# Patient Record
Sex: Female | Born: 1970 | Race: Black or African American | Hispanic: No | Marital: Single | Smoking: Never smoker
Health system: Southern US, Community
[De-identification: ages and names within clinical notes are randomized; demographics above are authoritative.]

## PROBLEM LIST (undated history)

## (undated) DIAGNOSIS — E049 Nontoxic goiter, unspecified: Secondary | ICD-10-CM

## (undated) DIAGNOSIS — Z8742 Personal history of other diseases of the female genital tract: Secondary | ICD-10-CM

## (undated) DIAGNOSIS — A6000 Herpesviral infection of urogenital system, unspecified: Secondary | ICD-10-CM

## (undated) DIAGNOSIS — G4700 Insomnia, unspecified: Secondary | ICD-10-CM

## (undated) DIAGNOSIS — E119 Type 2 diabetes mellitus without complications: Secondary | ICD-10-CM

## (undated) DIAGNOSIS — D530 Protein deficiency anemia: Secondary | ICD-10-CM

## (undated) DIAGNOSIS — F419 Anxiety disorder, unspecified: Secondary | ICD-10-CM

## (undated) DIAGNOSIS — R87810 Cervical high risk human papillomavirus (HPV) DNA test positive: Secondary | ICD-10-CM

## (undated) HISTORY — DX: Protein deficiency anemia: D53.0

## (undated) HISTORY — PX: COLPOSCOPY: SHX161

## (undated) HISTORY — DX: Insomnia, unspecified: G47.00

## (undated) HISTORY — DX: Personal history of other diseases of the female genital tract: Z87.42

## (undated) HISTORY — DX: Nontoxic goiter, unspecified: E04.9

## (undated) HISTORY — DX: Cervical high risk human papillomavirus (HPV) DNA test positive: R87.810

## (undated) HISTORY — DX: Anxiety disorder, unspecified: F41.9

## (undated) HISTORY — PX: COMBINED HYSTEROSCOPY DIAGNOSTIC / D&C: SUR297

## (undated) HISTORY — PX: CHOLECYSTECTOMY: SHX55

## (undated) HISTORY — DX: Herpesviral infection of urogenital system, unspecified: A60.00

---

## 2000-05-16 DIAGNOSIS — O321XX0 Maternal care for breech presentation, not applicable or unspecified: Secondary | ICD-10-CM

## 2006-01-16 ENCOUNTER — Ambulatory Visit: Payer: Self-pay

## 2008-03-03 ENCOUNTER — Ambulatory Visit: Payer: Self-pay

## 2009-08-11 ENCOUNTER — Ambulatory Visit: Payer: Self-pay

## 2010-08-15 ENCOUNTER — Ambulatory Visit: Payer: Self-pay

## 2010-08-15 HISTORY — PX: BREAST BIOPSY: SHX20

## 2010-08-29 ENCOUNTER — Ambulatory Visit: Payer: Self-pay | Admitting: Surgery

## 2011-02-14 ENCOUNTER — Ambulatory Visit: Payer: Self-pay | Admitting: Obstetrics and Gynecology

## 2011-02-23 ENCOUNTER — Ambulatory Visit: Payer: Self-pay | Admitting: Obstetrics and Gynecology

## 2011-08-14 ENCOUNTER — Ambulatory Visit: Payer: Self-pay | Admitting: Obstetrics and Gynecology

## 2011-08-16 ENCOUNTER — Ambulatory Visit: Payer: Self-pay | Admitting: Obstetrics and Gynecology

## 2011-08-22 ENCOUNTER — Ambulatory Visit: Payer: Self-pay | Admitting: Obstetrics and Gynecology

## 2011-09-15 ENCOUNTER — Ambulatory Visit: Payer: Self-pay | Admitting: Obstetrics and Gynecology

## 2011-10-16 ENCOUNTER — Ambulatory Visit: Payer: Self-pay | Admitting: Obstetrics and Gynecology

## 2012-01-08 IMAGING — US ULTRASOUND LEFT BREAST
1 series · 16 of 16 positions shown · non-contrast
Comparison: none

REASON FOR EXAM: Palpable 2 cm mass 12 o'clock left breast ; Jiwon Haubert at
[HOSPITAL] to follow
COMMENTS:

[Series 1: ultrasound left breast · 16 of 16 slices shown]
[im 1/16]
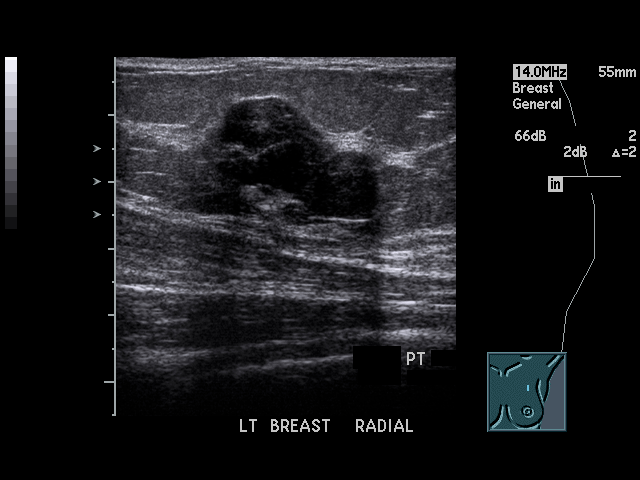
[im 2/16]
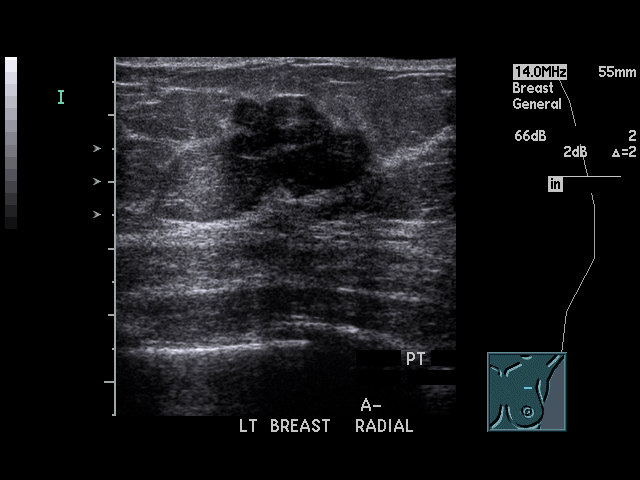
[im 3/16]
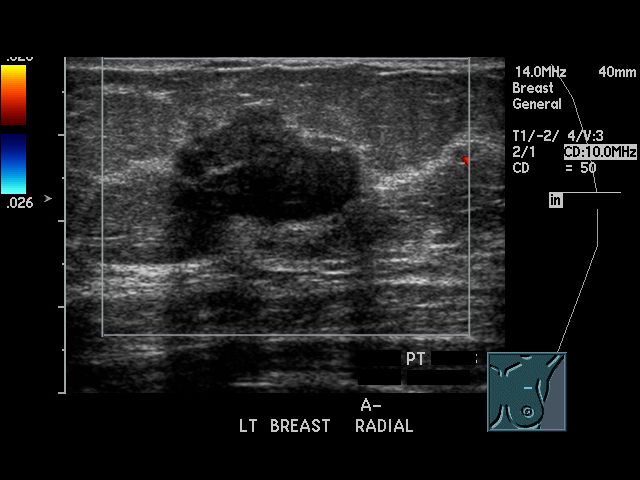
[im 4/16]
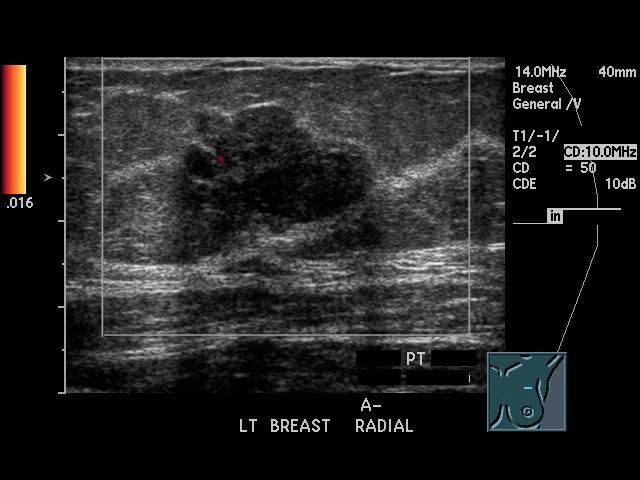
[im 5/16]
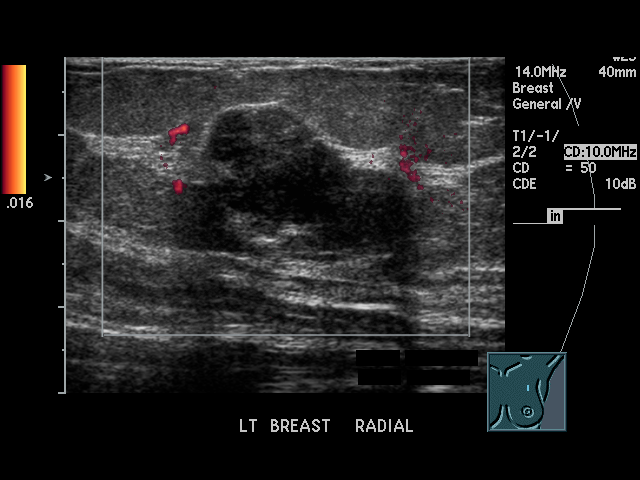
[im 6/16]
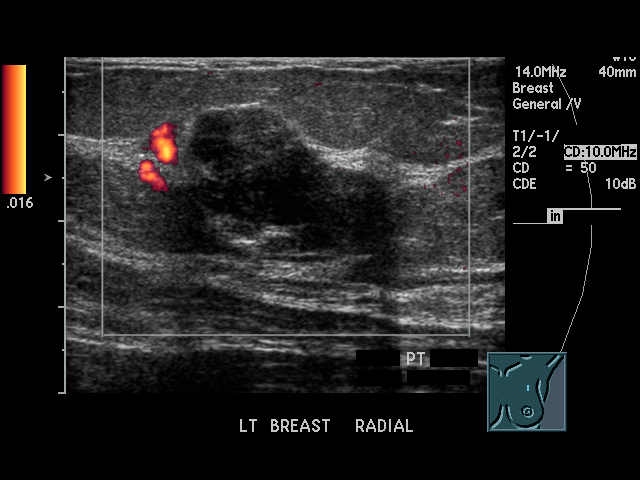
[im 7/16]
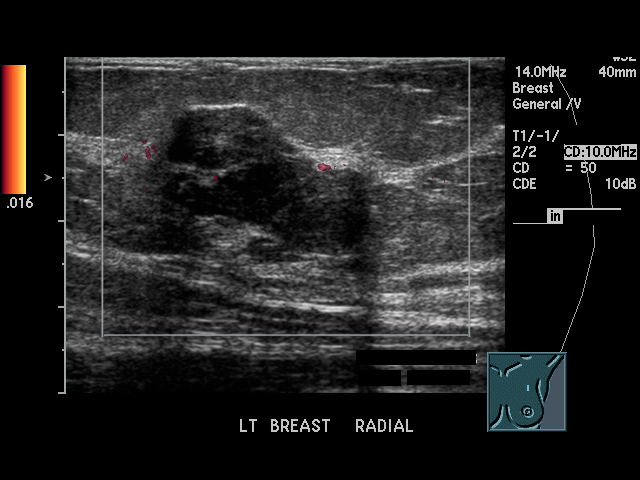
[im 8/16]
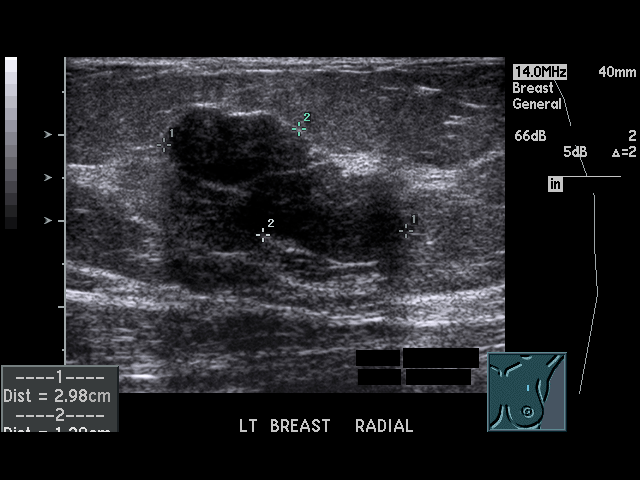
[im 9/16]
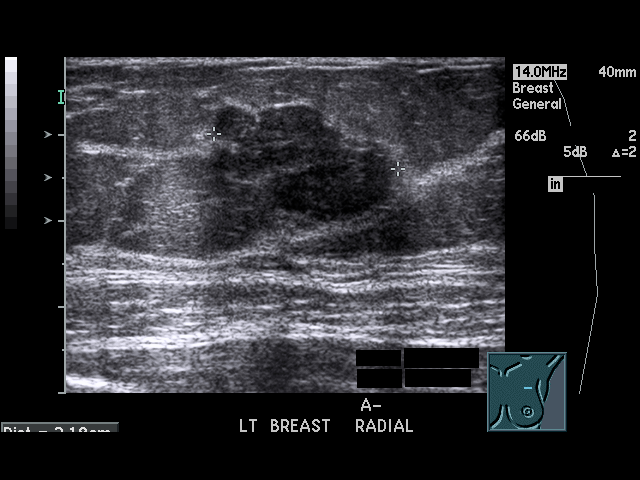
[im 10/16]
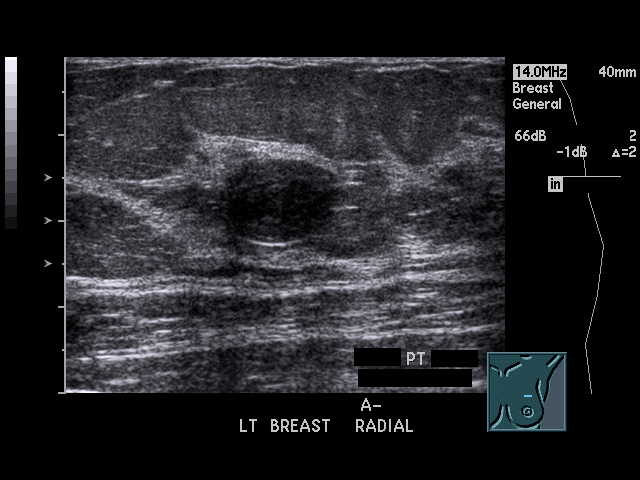
[im 11/16]
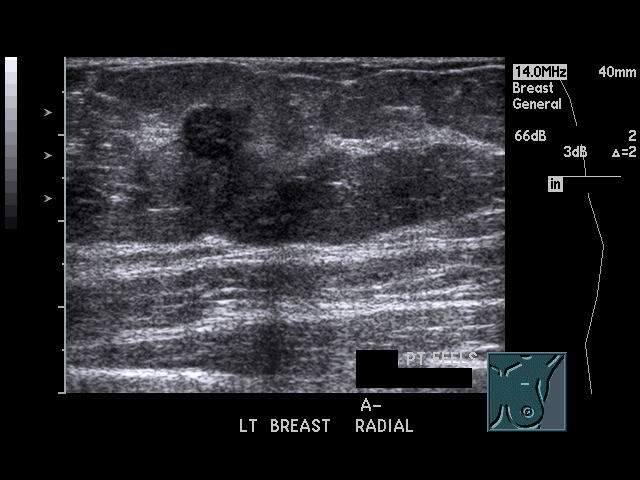
[im 12/16]
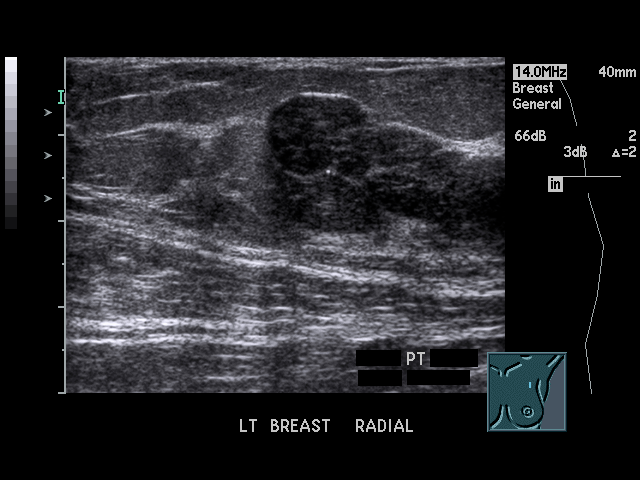
[im 13/16]
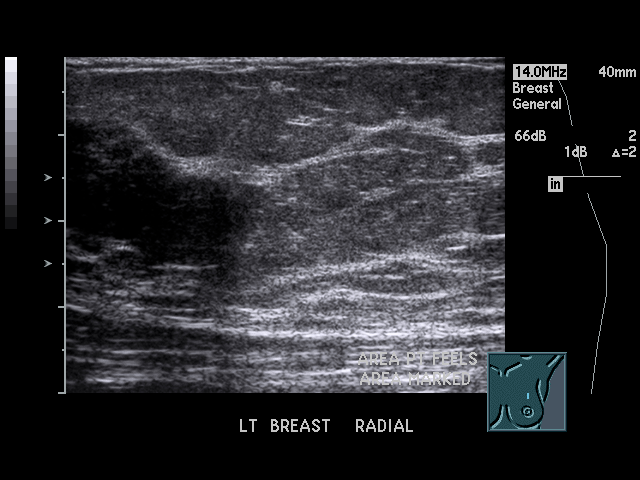
[im 14/16]
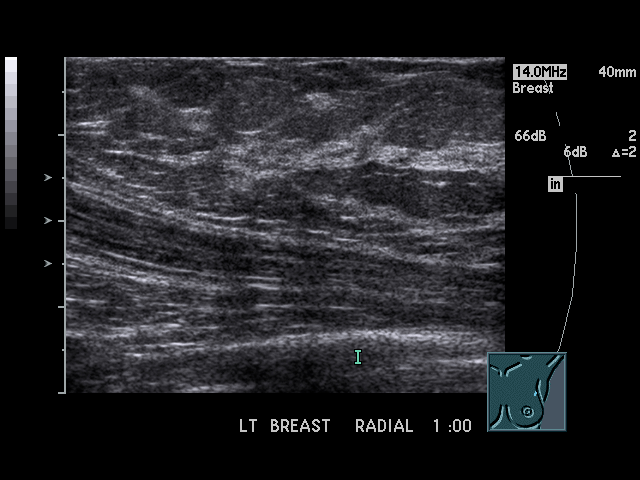
[im 15/16]
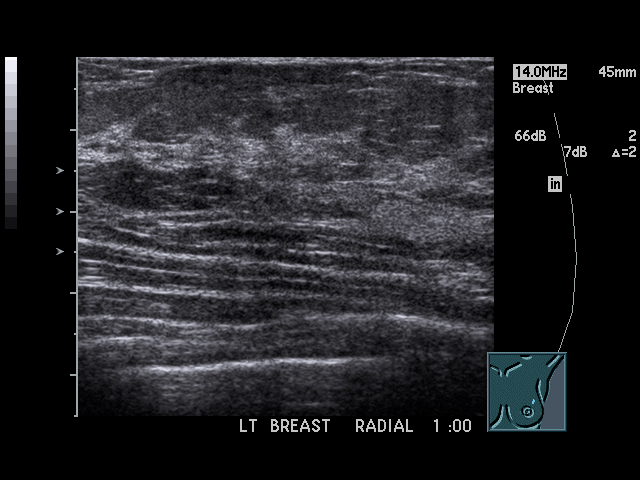
[im 16/16]
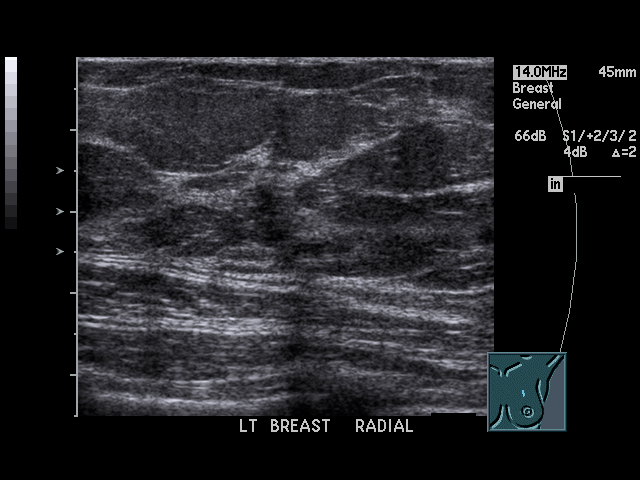

[16 of 16 positions shown; findings below may reference images not displayed]

PROCEDURE:     US  - US LT BREAST ([REDACTED])  - August 15, 2010 [DATE]

RESULT:     The study is correlated with a prior ultrasound report dated
08/11/2010.

The region of palpable concern is at the 12 o'clock position of the left
breast. A primarily hypoechoic, lobulated mass is identified with primarily
defined borders. There are areas of increased through transmission. This
nodular mass measures 2.98 x 1.3 x 2.18 cm. There are areas of peripheral
shadowing also associated with this mass. When correlated with the previous
report, the appearance of the mass is unchanged as well as the size. The
mass measured 2.17 x 1.4 x 2.2 cm on 08/11/2009. This mass is stable when
correlated with the previous report. No further solid or cystic
abnormalities are identified in the region of palpable concern.
IMPRESSION: Stable, lobulated, benign appearing mass within the left
breast when correlated with the previous report and prior mammograms dating
back to 01/16/2006. Please refer to the bilateral digital diagnostic
dictation for completed discussion.

## 2012-08-26 ENCOUNTER — Ambulatory Visit: Payer: Self-pay | Admitting: Obstetrics and Gynecology

## 2013-06-17 ENCOUNTER — Ambulatory Visit: Payer: Self-pay | Admitting: Orthopedic Surgery

## 2013-06-24 ENCOUNTER — Ambulatory Visit: Payer: Self-pay | Admitting: Orthopedic Surgery

## 2013-08-25 HISTORY — PX: INTRAUTERINE DEVICE INSERTION: SHX323

## 2013-09-07 ENCOUNTER — Other Ambulatory Visit: Payer: Self-pay | Admitting: Obstetrics and Gynecology

## 2013-09-09 ENCOUNTER — Ambulatory Visit: Payer: Self-pay | Admitting: Obstetrics and Gynecology

## 2013-10-15 DIAGNOSIS — Z8742 Personal history of other diseases of the female genital tract: Secondary | ICD-10-CM

## 2013-10-15 HISTORY — DX: Personal history of other diseases of the female genital tract: Z87.42

## 2014-01-18 HISTORY — PX: BIOPSY THYROID: PRO38

## 2014-09-13 ENCOUNTER — Ambulatory Visit: Payer: Self-pay

## 2015-07-04 DIAGNOSIS — E042 Nontoxic multinodular goiter: Secondary | ICD-10-CM | POA: Insufficient documentation

## 2015-11-22 ENCOUNTER — Encounter: Payer: Self-pay | Admitting: *Deleted

## 2015-11-22 ENCOUNTER — Emergency Department
Admission: EM | Admit: 2015-11-22 | Discharge: 2015-11-22 | Disposition: A | Discharge status: Home / Self Care | Payer: Commercial Managed Care - PPO | Attending: Emergency Medicine | Admitting: Emergency Medicine

## 2015-11-22 DIAGNOSIS — R21 Rash and other nonspecific skin eruption: Secondary | ICD-10-CM | POA: Insufficient documentation

## 2015-11-22 DIAGNOSIS — M79621 Pain in right upper arm: Secondary | ICD-10-CM | POA: Diagnosis present

## 2015-11-22 NOTE — ED Notes (Signed)
No xrays or labs at this time per dr Dahlia Client.

## 2015-11-22 NOTE — ED Notes (Addendum)
Pt has pain in right upper arm.  No known injury.  Pt has redness/rash to right upper arm .  Sx began at 1800 earlier tonight.  Pt took motrin 800 at 1900.  No relief with meds.  Pt alert.  Speech clear.

## 2016-01-20 ENCOUNTER — Encounter: Payer: Self-pay | Admitting: Emergency Medicine

## 2016-01-20 ENCOUNTER — Emergency Department
Admission: EM | Admit: 2016-01-20 | Discharge: 2016-01-20 | Disposition: A | Discharge status: Home / Self Care | Payer: Commercial Managed Care - PPO | Attending: Emergency Medicine | Admitting: Emergency Medicine

## 2016-01-20 DIAGNOSIS — J40 Bronchitis, not specified as acute or chronic: Secondary | ICD-10-CM | POA: Diagnosis not present

## 2016-01-20 DIAGNOSIS — F1099 Alcohol use, unspecified with unspecified alcohol-induced disorder: Secondary | ICD-10-CM | POA: Diagnosis not present

## 2016-01-20 DIAGNOSIS — I10 Essential (primary) hypertension: Secondary | ICD-10-CM | POA: Insufficient documentation

## 2016-01-20 DIAGNOSIS — Z041 Encounter for examination and observation following transport accident: Secondary | ICD-10-CM | POA: Diagnosis present

## 2016-01-20 HISTORY — DX: Type 2 diabetes mellitus without complications: E11.9

## 2016-01-20 MED ORDER — AZITHROMYCIN 250 MG PO TABS: ORAL_TABLET | ORAL | Status: DC

## 2016-01-20 MED ORDER — ALBUTEROL SULFATE HFA 108 (90 BASE) MCG/ACT IN AERS: 2.0000 | INHALATION_SPRAY | RESPIRATORY_TRACT | Status: DC | PRN

## 2016-01-20 MED ORDER — PREDNISONE 10 MG PO TABS: 10.0000 mg | ORAL_TABLET | ORAL | Status: DC
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
## 2016-01-20 NOTE — ED Notes (Signed)
Pt alert and oriented X4, active, cooperative, pt in NAD. RR even and unlabored, color WNL.  Pt informed to return if any life threatening symptoms occur.   
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
## 2016-01-29 ENCOUNTER — Emergency Department
Admission: EM | Admit: 2016-01-29 | Discharge: 2016-01-29 | Disposition: A | Discharge status: Home / Self Care | Payer: Commercial Managed Care - PPO | Attending: Emergency Medicine | Admitting: Emergency Medicine

## 2016-01-29 ENCOUNTER — Encounter: Payer: Self-pay | Admitting: Emergency Medicine

## 2016-01-29 DIAGNOSIS — B9689 Other specified bacterial agents as the cause of diseases classified elsewhere: Secondary | ICD-10-CM | POA: Insufficient documentation

## 2016-01-29 DIAGNOSIS — Z792 Long term (current) use of antibiotics: Secondary | ICD-10-CM | POA: Diagnosis not present

## 2016-01-29 DIAGNOSIS — Z7951 Long term (current) use of inhaled steroids: Secondary | ICD-10-CM | POA: Insufficient documentation

## 2016-01-29 DIAGNOSIS — B3731 Acute candidiasis of vulva and vagina: Secondary | ICD-10-CM

## 2016-01-29 DIAGNOSIS — Z7952 Long term (current) use of systemic steroids: Secondary | ICD-10-CM | POA: Diagnosis not present

## 2016-01-29 DIAGNOSIS — B373 Candidiasis of vulva and vagina: Secondary | ICD-10-CM | POA: Insufficient documentation

## 2016-01-29 DIAGNOSIS — E119 Type 2 diabetes mellitus without complications: Secondary | ICD-10-CM | POA: Diagnosis not present

## 2016-01-29 DIAGNOSIS — L292 Pruritus vulvae: Secondary | ICD-10-CM | POA: Diagnosis present

## 2016-01-29 DIAGNOSIS — N760 Acute vaginitis: Secondary | ICD-10-CM | POA: Diagnosis not present

## 2016-01-29 LAB — WET PREP, GENITAL
SPERM: NONE SEEN
Trich, Wet Prep: NONE SEEN

## 2016-01-29 MED ORDER — FLUCONAZOLE 150 MG PO TABS: 150.0000 mg | ORAL_TABLET | Freq: Every day | ORAL | Status: DC

## 2016-01-29 MED ORDER — METRONIDAZOLE 500 MG PO TABS: 500.0000 mg | ORAL_TABLET | Freq: Two times a day (BID) | ORAL | Status: DC

## 2016-01-29 NOTE — ED Notes (Signed)
NAD noted at time of D/C. Pt denies questions or concerns. Pt ambulatory to the lobby at this time.  

## 2016-01-29 NOTE — ED Notes (Signed)
States "I have a yeast infection from these medications".  Patient took z-pak last week.

## 2016-01-29 NOTE — ED Provider Notes (Signed)
[facility] Emergency Department Provider Note  ____________________________________________  Time seen: Approximately 8:01 AM  I have reviewed the triage vital signs and the nursing notes.   HISTORY  Chief Complaint Vaginal Itching   HPI Jillian Foster is a 45 y.o. female is here with complaint of vaginal itching. Patient states that last week she took Zithromax for infection and began itching yesterday. Patient states that  "always gets a yeast infection".Patient has not used any over-the-counter medication for her yeast infection or symptoms. Patient states she usually gets a prescription for Diflucan anytime she takes an antibiotic. Blood sugars usually run between 180 and 200. She denies any other symptoms and has 0/10 pain.   Past Medical History  Diagnosis Date  . Diabetes mellitus without complication ([place])     There are no active problems to display for this patient.   History reviewed. No pertinent past surgical history.  Current Outpatient Rx  Name  Route  Sig  Dispense  Refill  . albuterol (PROVENTIL HFA;VENTOLIN HFA) 108 (90 Base) MCG/ACT inhaler   Inhalation   Inhale 2 puffs into the lungs every 4 (four) hours as needed for wheezing or shortness of breath.   1 Inhaler   0   . azithromycin (ZITHROMAX Z-PAK) 250 MG tablet      Take 2 tablets (500 mg) on  Day 1,  followed by 1 tablet (250 mg) once daily on Days 2 through 5.   6 each   0   . fluconazole (DIFLUCAN) 150 MG tablet   Oral   Take 1 tablet (150 mg total) by mouth daily.   1 tablet   1   . metroNIDAZOLE (FLAGYL) 500 MG tablet   Oral   Take 1 tablet (500 mg total) by mouth 2 (two) times daily.   14 tablet   0   . predniSONE (DELTASONE) 10 MG tablet   Oral   Take 1 tablet (10 mg total) by mouth as directed.   21 tablet   0     Take on a daily basis of 6, 5, 4, 3, 2, 1     Allergies Review of patient's allergies indicates no known allergies.  No  family history on file.  Social History Social History  Substance Use Topics  . Smoking status: Never Smoker   . Smokeless tobacco: None  . Alcohol Use: Yes    Review of Systems Constitutional: No fever/chills Cardiovascular: Denies chest pain. Respiratory: Denies shortness of breath. Gastrointestinal: No nausea, no vomiting. Genitourinary:  Positive vaginal itching. Musculoskeletal: Negative for back pain. Skin: Negative for rash.   10-point ROS otherwise negative.  ____________________________________________   PHYSICAL EXAM:  VITAL SIGNS: ED Triage Vitals  Enc Vitals Group     BP 01/29/16 0732 125/73 mmHg     Pulse Rate 01/29/16 0732 84     Resp 01/29/16 0732 16     Temp 01/29/16 0732 98.2 F (36.8 C)     Temp Source 01/29/16 0732 Oral     SpO2 01/29/16 0732 97 %     Weight 01/29/16 0732 184 lb (83.462 kg)     Height 01/29/16 0732 5' 7"  (1.702 m)     Head Cir --      Peak Flow --      Pain Score 01/29/16 0734 0     Pain Loc --      Pain Edu? --      Excl. in [place]? --     Constitutional:  Alert and oriented. Well appearing and in no acute distress. Eyes: Conjunctivae are normal. PERRL. EOMI. Head: Atraumatic. Nose: No congestion/rhinnorhea. Neck: No stridor.  Cardiovascular: Normal rate, regular rhythm. Grossly normal heart sounds.  Good peripheral circulation. Respiratory: Normal respiratory effort.  No retractions. Lungs CTAB. Gastrointestinal: Soft and nontender. No distention.  No CVA tenderness. Genitourinary: Vaginal exam there is moderate amount of white exudate present. There is no blood in the vaginal vault. There is no tenderness on palpation of the adnexal areas bilaterally nor is there any cervical motion tenderness. Musculoskeletal: No lower extremity tenderness nor edema.  No joint effusions. Neurologic:  Normal speech and language. No gross focal neurologic deficits are appreciated. No gait instability. Skin:  Skin is warm, dry and intact. No  rash noted. Psychiatric: Mood and affect are normal. Speech and behavior are normal.  ____________________________________________   LABS (all labs ordered are listed, but only abnormal results are displayed)  Labs Reviewed  WET PREP, GENITAL - Abnormal; Notable for the following:    Yeast Wet Prep HPF POC PRESENT (*)    Clue Cells Wet Prep HPF POC PRESENT (*)    WBC, Wet Prep HPF POC MANY (*)    All other components within normal limits     PROCEDURES  Procedure(s) performed: None  Critical Care performed: No  ____________________________________________   INITIAL IMPRESSION / ASSESSMENT AND PLAN / ED COURSE  Pertinent labs & imaging results that were available during my care of the patient were reviewed by me and considered in my medical decision making (see chart for details).  Patient was made aware that she does indeed have a yeast infection but she also has bacterial vaginosis which she has had in the past. Patient was placed on Diflucan along with Flagyl. She'll follow-up with her regular OB/GYN if any continued problems. ____________________________________________   FINAL CLINICAL IMPRESSION(S) / ED DIAGNOSES  Final diagnoses:  Yeast vaginitis  BV (bacterial vaginosis)      Johnn Hai, PA-C 01/29/16 1545

## 2016-01-29 NOTE — Discharge Instructions (Signed)
Bacterial Vaginosis Bacterial vaginosis is an infection of the vagina. It happens when too many germs (bacteria) grow in the vagina. Having this infection puts you at risk for getting other infections from sex. Treating this infection can help lower your risk for other infections, such as:   Chlamydia.  Gonorrhea.  HIV.  Herpes. HOME CARE  Take your medicine as told by your doctor.  Finish your medicine even if you start to feel better.  Tell your sex partner that you have an infection. They should see their doctor for treatment.  During treatment:  Avoid sex or use condoms correctly.  Do not douche.  Do not drink alcohol unless your doctor tells you it is ok.  Do not breastfeed unless your doctor tells you it is ok. GET HELP IF:  You are not getting better after 3 days of treatment.  You have more grey fluid (discharge) coming from your vagina than before.  You have more pain than before.  You have a fever. MAKE SURE YOU:   Understand these instructions.  Will watch your condition.  Will get help right away if you are not doing well or get worse.   This information is not intended to replace advice given to you by your health care provider. Make sure you discuss any questions you have with your health care provider.   Document Released: 07/10/2008 Document Revised: 10/22/2014 Document Reviewed: 05/13/2013 Elsevier Interactive Patient Education 2016 [place] with your OB/GYN if any continued problems. Flagyl twice a day until finished. Diflucan for yeast infection with 1 refill if needed.

## 2016-05-22 ENCOUNTER — Other Ambulatory Visit: Payer: Self-pay | Admitting: Certified Nurse Midwife

## 2016-06-12 LAB — HM PAP SMEAR: HM PAP: NEGATIVE

## 2017-02-28 ENCOUNTER — Encounter: Payer: Self-pay | Admitting: Obstetrics and Gynecology

## 2017-02-28 ENCOUNTER — Ambulatory Visit (INDEPENDENT_AMBULATORY_CARE_PROVIDER_SITE_OTHER): Payer: Commercial Managed Care - PPO | Admitting: Obstetrics and Gynecology

## 2017-02-28 VITALS — BP 124/68 | HR 80 | Ht 66.0 in | Wt 196.0 lb

## 2017-02-28 DIAGNOSIS — E669 Obesity, unspecified: Secondary | ICD-10-CM | POA: Diagnosis not present

## 2017-02-28 DIAGNOSIS — Z6831 Body mass index (BMI) 31.0-31.9, adult: Secondary | ICD-10-CM

## 2017-02-28 MED ORDER — PHENTERMINE HCL 37.5 MG PO TABS: 37.5000 mg | ORAL_TABLET | Freq: Every day | ORAL | 0 refills | Status: DC

## 2017-02-28 NOTE — Progress Notes (Signed)
Gynecology Office Visit  Chief Complaint:  Chief Complaint  Patient presents with  . Weight Check    History of Present Illness: Patientis a 46 y.o. G73P0011 female, who presents for the evaluation of the desire to lose weight. She has gained 15 pounds since her last visit. Weight related co-morbidities include DM II.  She has previously taken phentermine with good response.  She is interested in restarting phentermine.   Review of Systems: 10 point review of systems negative unless otherwise noted in HPI  Past Medical History:  Past Medical History:  Diagnosis Date  . Diabetes mellitus without complication [facility])     Past Surgical History:  Past Surgical History:  Procedure Laterality Date  . CHOLECYSTECTOMY      Gynecologic History: No LMP recorded. Patient is not currently having periods (Reason: IUD).  Obstetric History: [id number]  Family History:  History reviewed. No pertinent family history.  Social History:  Social History   Social History  . Marital status: Single    Spouse name: N/A  . Number of children: N/A  . Years of education: N/A   Occupational History  . Not on file.   Social History Main Topics  . Smoking status: Never Smoker  . Smokeless tobacco: Never Used  . Alcohol use Yes  . Drug use: No  . Sexual activity: Yes    Birth control/ protection: IUD   Other Topics Concern  . Not on file   Social History Narrative  . No narrative on file    Allergies:  No Known Allergies  Medications: Prior to Admission medications   Medication Sig Start Date End Date Taking? Authorizing Provider  albuterol (PROVENTIL HFA;VENTOLIN HFA) 108 (90 Base) MCG/ACT inhaler Inhale 2 puffs into the lungs every 4 (four) hours as needed for wheezing or shortness of breath. 01/20/16  Yes Cuthriell, Charline Bills, PA-C  glipiZIDE (GLUCOTROL) 5 MG tablet Take by mouth daily before breakfast.   Yes [provider]  glucose blood ([employer] TEST STRIPS) test  strip Use once daily. 02/02/15  Yes [provider]  metFORMIN (GLUCOPHAGE-XR) 500 MG 24 hr tablet Take 500 mg by mouth daily with breakfast.   Yes [provider]  pravastatin (PRAVACHOL) 20 MG tablet Take 20 mg by mouth daily.   Yes [provider]  levonorgestrel (MIRENA) 20 MCG/24HR IUD by Intrauterine route.    [provider]    Physical Exam Vitals:  Vitals:   02/28/17 1646  BP: 124/68  Pulse: 80   No LMP recorded. Patient is not currently having periods (Reason: IUD). Filed Weights   02/28/17 1646  Weight: 196 lb (88.9 kg)   Body mass index is 31.64 kg/m. General: NAD HEENT: normocephalic, anicteric Thyroid: no enlargement Pulmonary: no increased work of breathing Neurologic: Grossly intact Psychiatric: mood appropriate, affect full  Assessment: 46 y.o. G2P0011 No problem-specific Assessment & Plan notes found for this encounter.   Plan: Problem List Items Addressed This Visit    None      1) 1500 Calorie ADA Diet  2) Patient education given regarding appropriate lifestyle changes for weight loss including: regular physical activity, healthy coping strategies, caloric restriction and healthy eating patterns.  3) Patient will be started on weight loss medication. The risks and benefits and side effects of medication, such as Adipex (Phenteramine) ,  Tenuate (Diethylproprion), Belviq (lorcarsin), Contrave (buproprion/naltrexone), Qsymia (phentermine/topiramate), and Saxenda (liraglutide) is discussed. The pros and cons of suppressing appetite and boosting metabolism is discussed. Risks  of tolerence and addiction is discussed for selected agents discussed. Use of medicine will ne short term, such as 3-4 months at a time followed by a period of time off of the medicine to avoid these risks and side effects for Adipex, Qsymia, and Tenuate discussed. Pt to call with any negative side effects and agrees to keep follow up appts.  4) Patient  to take medication, with the benefits of appetite suppression and metabolism boost d/w pt, along with the side effects and risk factors of long term use that will be avoided with our use of short bursts of therapy. Rx provided.    5) 15 minutes face-to-face; with counseling/coordination of care > 50 percent of visit related to obesity and ongoing management/treatment   6) Follow up in 4 weeks to assess response

## 2017-02-28 NOTE — Patient Instructions (Signed)

## 2017-03-26 ENCOUNTER — Encounter: Payer: Self-pay | Admitting: Obstetrics and Gynecology

## 2017-03-27 ENCOUNTER — Encounter: Payer: Self-pay | Admitting: Obstetrics and Gynecology

## 2017-03-27 ENCOUNTER — Ambulatory Visit (INDEPENDENT_AMBULATORY_CARE_PROVIDER_SITE_OTHER): Payer: Commercial Managed Care - PPO | Admitting: Obstetrics and Gynecology

## 2017-03-27 VITALS — BP 120/78 | HR 103 | Wt 195.0 lb

## 2017-03-27 DIAGNOSIS — Z6831 Body mass index (BMI) 31.0-31.9, adult: Secondary | ICD-10-CM | POA: Diagnosis not present

## 2017-03-27 DIAGNOSIS — E669 Obesity, unspecified: Secondary | ICD-10-CM | POA: Diagnosis not present

## 2017-03-27 MED ORDER — PHENTERMINE-TOPIRAMATE ER 3.75-23 MG PO CP24: 1.0000 | ORAL_CAPSULE | Freq: Every day | ORAL | 0 refills | Status: DC

## 2017-03-27 MED ORDER — PHENTERMINE-TOPIRAMATE ER 7.5-46 MG PO CP24: 1.0000 | ORAL_CAPSULE | Freq: Every day | ORAL | 0 refills | Status: DC

## 2017-03-27 NOTE — Progress Notes (Signed)
Gynecology Office Visit  Chief Complaint:  Chief Complaint  Patient presents with  . Weight Check    History of Present Illness: Patientis a 46 y.o. G45P1011 female, who presents for the evaluation of the desire to lose weight. She has lost 1 pounds. The patient states the following symptoms since starting her weight loss therapy: appetite suppression, energy, and weight loss.  The patient also reports no other ill effects. The patient specifically denies heart palpitations, anxiety, and insomnia.    Review of Systems: 10 point review of systems negative unless otherwise noted in HPI  Past Medical History:  Past Medical History:  Diagnosis Date  . 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 Anxiety   . Diabetes mellitus without complication ([place])   . Genital herpes   . Goiter   . Insomnia     Past Surgical History:  Past Surgical History:  Procedure Laterality Date  . BIOPSY THYROID  01/18/2014   B9 FNA  . BREAST BIOPSY  08/2010   Left benign   Fibroadenoma  . CESAREAN SECTION  05/2000   Failed ECV..Breech  . CHOLECYSTECTOMY    . COLPOSCOPY    . COMBINED HYSTEROSCOPY DIAGNOSTIC / D&C    . INTRAUTERINE DEVICE INSERTION  08/25/2013    Gynecologic History: No LMP recorded. Patient is not currently having periods (Reason: IUD).  Obstetric History: G2P1011  Family History:  Family History  Problem Relation Age of Onset  . Diabetes Maternal Grandmother        Type 2    Social History:  Social History   Social History  . Marital status: Single    Spouse name: N/A  . Number of children: N/A  . Years of education: N/A   Occupational History  . Not on file.   Social History Main Topics  . Smoking status: Never Smoker  . Smokeless tobacco: Never Used  . Alcohol use Yes  . Drug use: No  . Sexual activity: Yes    Birth control/ protection: IUD   Other Topics Concern  . Not on file   Social History Narrative  . No narrative on file    Allergies:  No  Known Allergies  Medications: Prior to Admission medications   Medication Sig Start Date End Date Taking? Authorizing Provider  albuterol (PROVENTIL HFA;VENTOLIN HFA) 108 (90 Base) MCG/ACT inhaler Inhale 2 puffs into the lungs every 4 (four) hours as needed for wheezing or shortness of breath. 01/20/16  Yes Cuthriell, Charline Bills, PA-C  glipiZIDE (GLUCOTROL) 5 MG tablet Take by mouth daily before breakfast.   Yes [provider]  glucose blood ([employer] TEST STRIPS) test strip Use once daily. 02/02/15  Yes [provider]  levonorgestrel (MIRENA) 20 MCG/24HR IUD by Intrauterine route.   Yes [provider]  metFORMIN (GLUCOPHAGE-XR) 500 MG 24 hr tablet Take 500 mg by mouth daily with breakfast.   Yes [provider]  phentermine (ADIPEX-P) 37.5 MG tablet Take 1 tablet (37.5 mg total) by mouth daily before breakfast. 02/28/17  Yes [name], MD  pravastatin (PRAVACHOL) 20 MG tablet Take 20 mg by mouth daily.   Yes [provider]    Physical Exam Vitals:  Vitals:   03/27/17 1625  BP: 120/78  Pulse: (!) 103   No LMP recorded. Patient is not currently having periods (Reason: IUD). Filed Weights   03/27/17 1625  Weight: 195 lb (88.5 kg)   Body mass index is 31.47 kg/m.  General: NAD HEENT:  normocephalic, anicteric Thyroid: no enlargement Pulmonary: no increased work of breathing Neurologic: Grossly intact Psychiatric: mood appropriate, affect full  Assessment: 46 y.o. G2P1011 No problem-specific Assessment & Plan notes found for this encounter.   Plan: Problem List Items Addressed This Visit    None      1) 1500 Calorie ADA Diet  2) Patient education given regarding appropriate lifestyle changes for weight loss including: regular physical activity, healthy coping strategies, caloric restriction and healthy eating patterns.  3) Patient will be started on weight loss medication. The risks and benefits and side effects of  medication, such as Adipex (Phenteramine) ,  Tenuate (Diethylproprion), Belviq (lorcarsin), Contrave (buproprion/naltrexone), Qsymia (phentermine/topiramate), and Saxenda (liraglutide) is discussed. The pros and cons of suppressing appetite and boosting metabolism is discussed. Risks of tolerence and addiction is discussed for selected agents discussed. Use of medicine will ne short term, such as 3-4 months at a time followed by a period of time off of the medicine to avoid these risks and side effects for Adipex, Qsymia, and Tenuate discussed. Pt to call with any negative side effects and agrees to keep follow up appts. Only 1 lbs weight loss, has tried belviq, contrave, and diethylproprion, will trial on Qsymia  4) Patient to take medication, with the benefits of appetite suppression and metabolism boost d/w pt, along with the side effects and risk factors of long term use that will be avoided with our use of short bursts of therapy. Rx provided.    5) 15 minutes face-to-face; with counseling/coordination of care > 50 percent of visit related to obesity and ongoing management/treatment   6) Follow up in 4 weeks to assess response

## 2017-04-01 ENCOUNTER — Other Ambulatory Visit: Payer: Self-pay | Admitting: Obstetrics and Gynecology

## 2017-04-01 ENCOUNTER — Telehealth: Payer: Self-pay | Admitting: Obstetrics and Gynecology

## 2017-04-01 MED ORDER — PHENTERMINE HCL 37.5 MG PO TABS: 37.5000 mg | ORAL_TABLET | Freq: Every day | ORAL | 0 refills | Status: DC

## 2017-04-01 NOTE — Progress Notes (Signed)
Weight correct 189lbs contine phentermine 7lbs weight loss in 1 month

## 2017-04-01 NOTE — Telephone Encounter (Signed)
Pt is calling to speak with Maudie Mercury, Dr. Danielle Rankin Nurse . Please call patient

## 2017-04-01 NOTE — Telephone Encounter (Signed)
Pt has question.  Please call [phone]

## 2017-04-01 NOTE — Telephone Encounter (Signed)
Shawn texted me and wants you to call her personally. [phone]

## 2017-04-01 NOTE — Telephone Encounter (Signed)
Call pt 

## 2017-05-02 ENCOUNTER — Encounter: Payer: Self-pay | Admitting: Obstetrics and Gynecology

## 2017-05-02 ENCOUNTER — Ambulatory Visit (INDEPENDENT_AMBULATORY_CARE_PROVIDER_SITE_OTHER): Payer: Commercial Managed Care - PPO | Admitting: Obstetrics and Gynecology

## 2017-05-02 VITALS — BP 120/78 | Wt 198.0 lb

## 2017-05-02 DIAGNOSIS — Z6831 Body mass index (BMI) 31.0-31.9, adult: Secondary | ICD-10-CM

## 2017-05-02 DIAGNOSIS — E669 Obesity, unspecified: Secondary | ICD-10-CM | POA: Diagnosis not present

## 2017-05-02 MED ORDER — LORCASERIN HCL ER 20 MG PO TB24: 1.0000 | ORAL_TABLET | Freq: Every day | ORAL | 2 refills | Status: DC

## 2017-05-02 NOTE — Progress Notes (Signed)
Gynecology Office Visit  Chief Complaint:  Chief Complaint  Patient presents with  . Weight Check    History of Present Illness: Patientis a 46 y.o. G12P1011 female, who presents for the evaluation of the desire to lose weight. She has gained 3 pounds. The patient states the following symptoms since starting her weight loss therapy: appetite suppression, energy, and weight loss.  The patient also reports no other ill effects. The patient specifically denies heart palpitations, anxiety, and insomnia.    Review of Systems: 10 point review of systems negative unless otherwise noted in HPI  Past Medical History:  Past Medical History:  Diagnosis Date  . 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 Anxiety   . Diabetes mellitus without complication ([place])   . Genital herpes   . Goiter   . Insomnia     Past Surgical History:  Past Surgical History:  Procedure Laterality Date  . BIOPSY THYROID  01/18/2014   B9 FNA  . BREAST BIOPSY  08/2010   Left benign   Fibroadenoma  . CESAREAN SECTION  05/2000   Failed ECV..Breech  . CHOLECYSTECTOMY    . COLPOSCOPY    . COMBINED HYSTEROSCOPY DIAGNOSTIC / D&C    . INTRAUTERINE DEVICE INSERTION  08/25/2013    Gynecologic History: No LMP recorded. Patient is not currently having periods (Reason: IUD).  Obstetric History: G2P1011  Family History:  Family History  Problem Relation Age of Onset  . Diabetes Maternal Grandmother        Type 2    Social History:  Social History   Social History  . Marital status: Single    Spouse name: N/A  . Number of children: N/A  . Years of education: N/A   Occupational History  . Not on file.   Social History Main Topics  . Smoking status: Never Smoker  . Smokeless tobacco: Never Used  . Alcohol use Yes  . Drug use: No  . Sexual activity: Yes    Birth control/ protection: IUD   Other Topics Concern  . Not on file   Social History Narrative  . No narrative on file    Allergies:  No  Known Allergies  Medications: Prior to Admission medications   Medication Sig Start Date End Date Taking? Authorizing Provider  albuterol (PROVENTIL HFA;VENTOLIN HFA) 108 (90 Base) MCG/ACT inhaler Inhale 2 puffs into the lungs every 4 (four) hours as needed for wheezing or shortness of breath. 01/20/16   Cuthriell, Charline Bills, PA-C  glipiZIDE (GLUCOTROL) 5 MG tablet Take by mouth daily before breakfast.    [provider]  glucose blood ([employer] TEST STRIPS) test strip Use once daily. 02/02/15   [provider]  levonorgestrel (MIRENA) 20 MCG/24HR IUD by Intrauterine route.    [provider]  metFORMIN (GLUCOPHAGE-XR) 500 MG 24 hr tablet Take 500 mg by mouth daily with breakfast.    [provider]  phentermine (ADIPEX-P) 37.5 MG tablet Take 1 tablet (37.5 mg total) by mouth daily before breakfast. 04/01/17   [name], MD  pravastatin (PRAVACHOL) 20 MG tablet Take 20 mg by mouth daily.    [provider]    Physical Exam Blood pressure 120/78, weight 198 lb (89.8 kg). Body mass index is 31.96 kg/m.  General: NAD HEENT: normocephalic, anicteric Thyroid: no enlargement Pulmonary: no increased work of breathing Neurologic: Grossly intact Psychiatric: mood appropriate, affect full  Assessment: 46 y.o. G2P1011 weight loss follow up  Plan: Problem List Items  Addressed This Visit    None    Visit Diagnoses    Class 1 obesity with serious comorbidity and body mass index (BMI) of 31.0 to 31.9 in adult, unspecified obesity type    -  Primary   Relevant Medications   Lorcaserin HCl ER (BELVIQ XR) 20 MG TB24      1) 1500 Calorie ADA Diet  2) Patient education given regarding appropriate lifestyle changes for weight loss including: regular physical activity, healthy coping strategies, caloric restriction and healthy eating patterns.  3) Patient will be started on weight loss medication. The risks and benefits and side effects of  medication, such as Adipex (Phenteramine) ,  Tenuate (Diethylproprion), Belviq (lorcarsin), Contrave (buproprion/naltrexone), Qsymia (phentermine/topiramate), and Saxenda (liraglutide) is discussed. The pros and cons of suppressing appetite and boosting metabolism is discussed. Risks of tolerence and addiction is discussed for selected agents discussed. Use of medicine will ne short term, such as 3-4 months at a time followed by a period of time off of the medicine to avoid these risks and side effects for Adipex, Qsymia, and Tenuate discussed. Pt to call with any negative side effects and agrees to keep follow up appts.  4) Patient to take medication, with the benefits of appetite suppression and metabolism boost d/w pt, along with the side effects and risk factors of long term use that will be avoided with our use of short bursts of therapy. Rx provided (Qsymia to expensive so switching to belviq)   5) 15 minutes face-to-face; with counseling/coordination of care > 50 percent of visit related to obesity and ongoing management/treatment   6) Follow up in 12 weeks to assess response

## 2017-05-03 ENCOUNTER — Telehealth: Payer: Self-pay

## 2017-05-03 NOTE — Telephone Encounter (Signed)
Pt aware, via voicemail, Prior Authorization has been started through CoverMyMeds, for Belviq. I will keep her updated on status. KJ CMA

## 2017-05-08 ENCOUNTER — Encounter: Payer: Self-pay | Admitting: Obstetrics and Gynecology

## 2017-05-08 ENCOUNTER — Other Ambulatory Visit: Payer: Self-pay

## 2017-05-08 ENCOUNTER — Telehealth: Payer: Self-pay

## 2017-05-08 ENCOUNTER — Other Ambulatory Visit: Payer: Self-pay | Admitting: Obstetrics and Gynecology

## 2017-05-08 MED ORDER — LORCASERIN HCL ER 20 MG PO TB24: 1.0000 | ORAL_TABLET | Freq: Every day | ORAL | 2 refills | Status: DC

## 2017-05-08 NOTE — Telephone Encounter (Signed)
Pt aware, via voicemail, the Rx for Belviq has been approved through 11/06/2017. KJ CMA

## 2017-05-08 NOTE — Telephone Encounter (Signed)
Please send Belviq to [place]

## 2017-05-09 ENCOUNTER — Other Ambulatory Visit: Payer: Self-pay

## 2017-05-10 ENCOUNTER — Ambulatory Visit: Payer: Commercial Managed Care - PPO | Admitting: Obstetrics and Gynecology

## 2017-06-06 ENCOUNTER — Ambulatory Visit (INDEPENDENT_AMBULATORY_CARE_PROVIDER_SITE_OTHER): Payer: Commercial Managed Care - PPO

## 2017-06-06 ENCOUNTER — Ambulatory Visit (INDEPENDENT_AMBULATORY_CARE_PROVIDER_SITE_OTHER): Payer: Commercial Managed Care - PPO | Admitting: Obstetrics and Gynecology

## 2017-06-06 ENCOUNTER — Encounter: Payer: Self-pay | Admitting: Obstetrics and Gynecology

## 2017-06-06 VITALS — BP 102/72 | Ht 66.0 in | Wt 190.0 lb

## 2017-06-06 DIAGNOSIS — R05 Cough: Secondary | ICD-10-CM

## 2017-06-06 DIAGNOSIS — B9689 Other specified bacterial agents as the cause of diseases classified elsewhere: Secondary | ICD-10-CM | POA: Diagnosis not present

## 2017-06-06 DIAGNOSIS — Z975 Presence of (intrauterine) contraceptive device: Secondary | ICD-10-CM

## 2017-06-06 DIAGNOSIS — Z113 Encounter for screening for infections with a predominantly sexual mode of transmission: Secondary | ICD-10-CM

## 2017-06-06 DIAGNOSIS — N921 Excessive and frequent menstruation with irregular cycle: Secondary | ICD-10-CM | POA: Diagnosis not present

## 2017-06-06 DIAGNOSIS — R0981 Nasal congestion: Secondary | ICD-10-CM | POA: Diagnosis not present

## 2017-06-06 DIAGNOSIS — R059 Cough, unspecified: Secondary | ICD-10-CM

## 2017-06-06 DIAGNOSIS — Z30431 Encounter for routine checking of intrauterine contraceptive device: Secondary | ICD-10-CM

## 2017-06-06 DIAGNOSIS — N760 Acute vaginitis: Secondary | ICD-10-CM

## 2017-06-06 LAB — POCT WET PREP WITH KOH
Clue Cells Wet Prep HPF POC: POSITIVE
KOH PREP POC: POSITIVE — AB
Trichomonas, UA: NEGATIVE
Yeast Wet Prep HPF POC: NEGATIVE

## 2017-06-06 MED ORDER — METRONIDAZOLE 0.75 % VA GEL
1.0000 | Freq: Every day | VAGINAL | 1 refills | Status: AC
Start: 2017-06-06 — End: 2017-06-11

## 2017-06-06 NOTE — Progress Notes (Signed)
Chief Complaint  Patient presents with  . Contraception    IUD check    HPI:      Jillian Foster is a 46 y.o. G2P1011 who LMP was No LMP recorded. Patient is not currently having periods (Reason: IUD)., presents today for irregular bleeding for the past 2 wks. Blood is bright red and brown. She denies any pain, cramping. She has had amenorrhea since Mirena placed 08/21/13. She is sex active, no new partners.   She also has noticed and increased d/c with odor, no itching. She may have had BV in the past.   She also developed nasal congestion, chest tightness, cough and facial pressure, with sore throat in AM about 1 1/2 days ago. She is not taking any meds due to DM meds and not sure what she can take. She has felt feverish at night.    Past Medical History:  Diagnosis Date  . 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 Anxiety   . Diabetes mellitus without complication ([place])   . Genital herpes   . Goiter   . Insomnia     Past Surgical History:  Procedure Laterality Date  . BIOPSY THYROID  01/18/2014   B9 FNA  . BREAST BIOPSY  08/2010   Left benign   Fibroadenoma  . CESAREAN SECTION  05/2000   Failed ECV..Breech  . CHOLECYSTECTOMY    . COLPOSCOPY    . COMBINED HYSTEROSCOPY DIAGNOSTIC / D&C    . INTRAUTERINE DEVICE INSERTION  08/25/2013    Family History  Problem Relation Age of Onset  . Diabetes Maternal Grandmother        Type 2    Social History   Social History  . Marital status: Single    Spouse name: N/A  . Number of children: N/A  . Years of education: N/A   Occupational History  . Not on file.   Social History Main Topics  . Smoking status: Never Smoker  . Smokeless tobacco: Never Used  . Alcohol use Yes  . Drug use: No  . Sexual activity: Yes    Birth control/ protection: IUD   Other Topics Concern  . Not on file   Social History Narrative  . No narrative on file     Current Outpatient Prescriptions:  .  albuterol (PROVENTIL  HFA;VENTOLIN HFA) 108 (90 Base) MCG/ACT inhaler, Inhale 2 puffs into the lungs every 4 (four) hours as needed for wheezing or shortness of breath., Disp: 1 Inhaler, Rfl: 0 .  Blood Glucose Monitoring Suppl (GLUCOCOM BLOOD GLUCOSE MONITOR) DEVI, One Touch Verio meter. Use to check sugar once daily., Disp: , Rfl:  .  Dulaglutide 0.75 MG/0.5ML SOPN, Inject 0.75 mg into the skin., Disp: , Rfl:  .  empagliflozin (JARDIANCE) 10 MG TABS tablet, Take 10 mg by mouth., Disp: , Rfl:  .  glipiZIDE (GLUCOTROL) 5 MG tablet, Take by mouth daily before breakfast., Disp: , Rfl:  .  glucose blood ([employer] TEST STRIPS) test strip, Use once daily., Disp: , Rfl:  .  levonorgestrel (MIRENA) 20 MCG/24HR IUD, by Intrauterine route., Disp: , Rfl:  .  metFORMIN (GLUCOPHAGE-XR) 500 MG 24 hr tablet, Take 500 mg by mouth daily with breakfast., Disp: , Rfl:  .  pravastatin (PRAVACHOL) 20 MG tablet, Take 20 mg by mouth daily., Disp: , Rfl:  .  metroNIDAZOLE (METROGEL) 0.75 % vaginal gel, Place 1 Applicatorful vaginally at bedtime., Disp: 70 g, Rfl: 1   ROS:  Review of Systems  Constitutional: Positive for fever.  HENT: Positive for congestion, postnasal drip, rhinorrhea, sinus pressure and sore throat. Negative for ear discharge, ear pain and sinus pain.   Respiratory: Positive for cough and chest tightness. Negative for shortness of breath.   Gastrointestinal: Negative for blood in stool, constipation, diarrhea, nausea and vomiting.  Genitourinary: Positive for menstrual problem, vaginal discharge and vaginal pain. Negative for dyspareunia, dysuria, flank pain, frequency, hematuria, urgency and vaginal bleeding.  Musculoskeletal: Negative for back pain.  Skin: Negative for rash.     OBJECTIVE:   Vitals:  BP 102/72   Ht 5' 6"  (1.676 m)   Wt 190 lb (86.2 kg)   BMI 30.67 kg/m   Physical Exam  Constitutional: She is oriented to person, place, and time and well-developed, well-nourished, and in no distress. Vital  signs are normal.  HENT:  Nose: Right sinus exhibits no maxillary sinus tenderness and no frontal sinus tenderness. Left sinus exhibits no maxillary sinus tenderness and no frontal sinus tenderness.  Neck: Normal range of motion. Neck supple.  Cardiovascular: Normal rate and regular rhythm.   Pulmonary/Chest: Effort normal and breath sounds normal. She has no wheezes. She has no rales.  Genitourinary: Uterus normal, cervix normal, right adnexa normal, left adnexa normal and vulva normal. Uterus is not enlarged. Cervix exhibits no motion tenderness and no tenderness. Right adnexum displays no mass and no tenderness. Left adnexum displays no mass and no tenderness. Vulva exhibits no erythema, no exudate, no lesion, no rash and no tenderness. Vagina exhibits no lesion. Thin  white and vaginal discharge found.  Genitourinary Comments: IUD STRINGS NOT VISIBLE IN CX OS  Neurological: She is oriented to person, place, and time.  Vitals reviewed.   Results: Results for orders placed or performed in visit on 06/06/17 (from the past 24 hour(s))  POCT Wet Prep with KOH     Status: Abnormal   Collection Time: 06/06/17 12:00 PM  Result Value Ref Range   Trichomonas, UA Negative    Clue Cells Wet Prep HPF POC pos    Epithelial Wet Prep HPF POC  Few, Moderate, Many, Too numerous to count   Yeast Wet Prep HPF POC neg    Bacteria Wet Prep HPF POC  Few   RBC Wet Prep HPF POC     WBC Wet Prep HPF POC     KOH Prep POC Positive (A) Negative   GYN U/S-->EM=2.08 MM; IUD IN PLACE; NO FF IN CDS; 1.2 CM LEIO; BILAT OVAR WITH FOLLICLES    Assessment/Plan: Breakthrough bleeding with IUD - Neg GYN u/s. If gon/chlam neg, reassurance. F/u prn.  - Plan: Chlamydia/Gonococcus/Trichomonas, NAA, US Transvaginal Non-OB  Encounter for routine checking of intrauterine contraceptive device (IUD) - IUD strings not visible. IUD in place per u/s. Reassurance. - Plan: US Transvaginal Non-OB  Screening for STD (sexually  transmitted disease) - Plan: Chlamydia/Gonococcus/Trichomonas, NAA  Bacterial vaginosis - Pos wet prep. Rx metrogel. F/u prn.  - Plan: POCT Wet Prep with KOH, metroNIDAZOLE (METROGEL) 0.75 % vaginal gel  Nasal congestion - URI for 1 1/2 days. Can do OTC meds. F/u prn.   Cough    Return if symptoms worsen or fail to improve.  Latravia [place] B. Jacque Byron, PA-C 06/06/2017 12:09 PM

## 2017-06-08 LAB — CHLAMYDIA/GONOCOCCUS/TRICHOMONAS, NAA
CHLAMYDIA BY NAA: NEGATIVE
GONOCOCCUS BY NAA: NEGATIVE
TRICH VAG BY NAA: NEGATIVE

## 2017-06-25 ENCOUNTER — Ambulatory Visit (INDEPENDENT_AMBULATORY_CARE_PROVIDER_SITE_OTHER): Payer: Commercial Managed Care - PPO | Admitting: Certified Nurse Midwife

## 2017-06-25 ENCOUNTER — Encounter: Payer: Self-pay | Admitting: Certified Nurse Midwife

## 2017-06-25 VITALS — BP 102/62 | HR 98 | Ht 66.0 in | Wt 191.0 lb

## 2017-06-25 DIAGNOSIS — Z124 Encounter for screening for malignant neoplasm of cervix: Secondary | ICD-10-CM | POA: Diagnosis not present

## 2017-06-25 DIAGNOSIS — Z01419 Encounter for gynecological examination (general) (routine) without abnormal findings: Secondary | ICD-10-CM | POA: Diagnosis not present

## 2017-06-25 DIAGNOSIS — Z1239 Encounter for other screening for malignant neoplasm of breast: Secondary | ICD-10-CM

## 2017-06-25 DIAGNOSIS — Z1231 Encounter for screening mammogram for malignant neoplasm of breast: Secondary | ICD-10-CM | POA: Diagnosis not present

## 2017-06-25 DIAGNOSIS — Z1211 Encounter for screening for malignant neoplasm of colon: Secondary | ICD-10-CM | POA: Diagnosis not present

## 2017-06-25 DIAGNOSIS — E049 Nontoxic goiter, unspecified: Secondary | ICD-10-CM | POA: Diagnosis not present

## 2017-06-25 NOTE — Progress Notes (Signed)
Gynecology Annual Exam  PCP: Patient, No Pcp Per  Chief Complaint:  Chief Complaint  Patient presents with  . Gynecologic Exam    History of Present Illness:Jillian Foster is a 46 year old African American/Black female, G1 P1001, who presents for her annual exam. She reports having a pelvic exam, STD check and pelvic ultrasound last month when she presented with spotting. She was treated for BV. Her IUD strings were not visible and the pelvic ultrasound showed that her IUD was in place. Her BV symptoms have resolved. Her menses are absent due to her IUD.  She has occasional spotting.   The patient's past medical history is notable for a history of T2DM and is being followed by an endocrinologist at [facility]. 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 Reports having a long history of a left breast lump which was biopsied in 2011 and was B9. Does not think the lump has gotten any bigger. She also has a multinodular goiter and she has had some B9 biopsies by Dr Gabriel Carina.  Since her last annual GYN exam dated 06/12/2016, she has also been started on Trulicity.  She is sexually active. She is currently using an IUD for contraception. Mirena IUD was placed on 08/21/2013.  Her most recent pap smear was obtained 06/12/2016 and was NIL with negative HRHPV.Marland Kitchen She had a unsatisfactory colpo in Jan 2016 with a negative ECC.  Her most recent mammogram obtained on 09/13/2014 was normal and revealed no significant changes.  There is no family history of breast cancer.  There is no family history of ovarian cancer.  The patient does do monthly self breast exams.  The patient does not smoke.  The patient does drink alcohol occasionally  The patient does not use illegal drugs.  The patient does not exercise except for walking.  The patient does not get adequate calcium in her diet.  She had a recent cholesterol screen in 2018 that was normal.    The patient denies  current symptoms of depression.    Review of Systems: Review of Systems  Constitutional: Negative for chills, fever and weight loss.  HENT: Negative for congestion, sinus pain and sore throat.   Eyes: Negative for blurred vision and pain.  Respiratory: Negative for hemoptysis, shortness of breath and wheezing.   Cardiovascular: Negative for chest pain, palpitations and leg swelling.  Gastrointestinal: Negative for abdominal pain, blood in stool, diarrhea, heartburn, nausea and vomiting.  Genitourinary: Negative for dysuria, frequency, hematuria and urgency.  Musculoskeletal: Negative for back pain, joint pain and myalgias.  Skin: Negative for itching and rash.  Neurological: Negative for dizziness, tingling and headaches.  Endo/Heme/Allergies: Negative for environmental allergies and polydipsia. Does not bruise/bleed easily.       Negative for hirsutism   Psychiatric/Behavioral: Negative for depression. The patient is not nervous/anxious and does not have insomnia.     Past Medical History:  Past Medical History:  Diagnosis Date  . 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 Anxiety   . Diabetes mellitus without complication ([place])   . Genital herpes   . Goiter   . High risk human papilloma cervical smear 2015  . History of abnormal cervical Pap smear    POsitive HRHPV  . Insomnia     Past Surgical History:  Past Surgical History:  Procedure Laterality Date  . BIOPSY THYROID  01/18/2014   B9 FNA  .  BREAST BIOPSY  08/2010   Left benign   Fibroadenoma  . CESAREAN SECTION  05/2000   Failed ECV..Breech  . CHOLECYSTECTOMY    . COLPOSCOPY    . COMBINED HYSTEROSCOPY DIAGNOSTIC / D&C    . INTRAUTERINE DEVICE INSERTION  08/25/2013    Family History:  Family History  Problem Relation Age of Onset  . Diabetes Maternal Grandmother        Type 2  . Cancer Neg Hx   . Hypertension Neg Hx   . Stroke Neg Hx   . Thyroid disease Neg Hx     Social History:  Social History   Social  History  . Marital status: Single    Spouse name: N/A  . Number of children: 1  . Years of education: N/A   Occupational History  . Not on file.   Social History Main Topics  . Smoking status: Never Smoker  . Smokeless tobacco: Never Used  . Alcohol use Yes  . Drug use: No  . Sexual activity: Yes    Partners: Male    Birth control/ protection: IUD   Other Topics Concern  . Not on file   Social History Narrative  . No narrative on file    Allergies:  No Known Allergies  Medications: Prior to Admission medications   Medication Sig Start Date End Date Taking? Authorizing Provider         Blood Glucose Monitoring Suppl (GLUCOCOM BLOOD GLUCOSE MONITOR) DEVI One Touch Verio meter. Use to check sugar once daily. 12/19/16   [provider]  Dulaglutide 0.75 MG/0.5ML SOPN Inject 0.75 mg into the skin. 05/07/17   [provider]  empagliflozin (JARDIANCE) 10 MG TABS tablet Take 10 mg by mouth. 05/14/17 05/14/18  [provider]  glipiZIDE (GLUCOTROL) 5 MG tablet Take by mouth daily before breakfast.    [provider]  glucose blood ([employer] TEST STRIPS) test strip Use once daily. 02/02/15   [provider]  levonorgestrel (MIRENA) 20 MCG/24HR IUD by Intrauterine route.    [provider]  metFORMIN (GLUCOPHAGE-XR) 500 MG 24 hr tablet Take 500 mg by mouth daily with breakfast.    [provider]  pravastatin (PRAVACHOL) 20 MG tablet Take 20 mg by mouth daily.    [provider]    Physical Exam Vitals: BP 102/62   Pulse 98   Ht 5' 6"  (1.676 m)   Wt 191 lb (86.6 kg)   LMP  (Exact Date)   BMI 30.83 kg/m   General: BF in  NAD HEENT: normocephalic, anicteric Neck: enlarged left lobe, soft, NT, no cervical lymphadenopathy  Pulmonary: No increased work of breathing, CTAB Cardiovascular: RRR, without murmur  Breast: Breast symmetrical, no tenderness, 1-2 cm nodule in left breast at 11-12 o'clock (no change) no  mass in right breast, no skin or nipple retraction present, no nipple discharge.  No axillary, infraclavicular or supraclavicular lymphadenopathy. Abdomen: Soft, non-tender, non-distended.  Umbilicus without lesions.  No hepatomegaly or masses palpable. No evidence of hernia. Genitourinary:  External: Normal external female genitalia. Small folliculitis on mons. Normal urethral meatus, normal Bartholin's and Skene's glands.    Vagina: Normal vaginal mucosa, no evidence of prolapse.    Cervix: Stenotic, no bleeding, non-tender, no iud strings visible  Bimanual deferred due to recent ultrasound and pelvic exam.  Lymphatic: no evidence of inguinal lymphadenopathy Extremities: no edema, erythema, or tenderness Neurologic: Grossly intact Psychiatric: mood appropriate, affect full     Assessment: 46 y.o. [id number]  annual gyn exam Missing IUD strings but IUD in place per ultrasound Stenotic os which may make IUD replacement difficult next year  Plan:   1) Breast cancer screening - recommend monthly self breast exam. Mammogram was ordered today. Patient to schedule at [facility]  2) STI screening was offered and declined. Just had recent STD screen.  3) Cervical cancer screening - Pap was done. ASCCP guidelines and rational discussed.  Patient opts for yearly screening interval  4) Contraception - Plans on replacing IUD next year when expires. May need hysteroscopy to assist with removal, or possibly can bedone under paracervical block.  5) Routine healthcare maintenance including cholesterol screening managed by PCP   6) Colon cancer screening-discussed options. Decided to do annual FIT testing. Given home collection test.  7) RTO 1 year for annual  Dalia Heading, [place]

## 2017-06-27 LAB — IGP,RFX APTIMA HPV ALL PTH: PAP SMEAR COMMENT: 0

## 2017-06-30 ENCOUNTER — Encounter: Payer: Self-pay | Admitting: Certified Nurse Midwife

## 2017-06-30 DIAGNOSIS — E049 Nontoxic goiter, unspecified: Secondary | ICD-10-CM | POA: Insufficient documentation

## 2017-06-30 DIAGNOSIS — Z8742 Personal history of other diseases of the female genital tract: Secondary | ICD-10-CM | POA: Insufficient documentation

## 2017-06-30 DIAGNOSIS — F419 Anxiety disorder, unspecified: Secondary | ICD-10-CM | POA: Insufficient documentation

## 2017-06-30 DIAGNOSIS — E119 Type 2 diabetes mellitus without complications: Secondary | ICD-10-CM | POA: Insufficient documentation

## 2017-06-30 DIAGNOSIS — A6000 Herpesviral infection of urogenital system, unspecified: Secondary | ICD-10-CM | POA: Insufficient documentation

## 2017-07-31 ENCOUNTER — Ambulatory Visit: Payer: Commercial Managed Care - PPO | Admitting: Obstetrics and Gynecology

## 2017-09-02 ENCOUNTER — Encounter: Payer: Self-pay | Admitting: Obstetrics and Gynecology

## 2018-01-09 ENCOUNTER — Ambulatory Visit (INDEPENDENT_AMBULATORY_CARE_PROVIDER_SITE_OTHER): Payer: Commercial Managed Care - PPO | Admitting: Obstetrics and Gynecology

## 2018-01-09 ENCOUNTER — Encounter: Payer: Self-pay | Admitting: Obstetrics and Gynecology

## 2018-01-09 VITALS — BP 124/84 | HR 106 | Ht 67.0 in | Wt 198.0 lb

## 2018-01-09 DIAGNOSIS — E669 Obesity, unspecified: Secondary | ICD-10-CM | POA: Diagnosis not present

## 2018-01-09 DIAGNOSIS — Z6831 Body mass index (BMI) 31.0-31.9, adult: Secondary | ICD-10-CM

## 2018-01-09 MED ORDER — PHENTERMINE HCL 37.5 MG PO TABS: 37.5000 mg | ORAL_TABLET | Freq: Every day | ORAL | 0 refills | Status: DC

## 2018-01-09 NOTE — Progress Notes (Signed)
Gynecology Office Visit  Chief Complaint:  Chief Complaint  Patient presents with  . Weight consult    History of Present Illness: Patientis a 47 y.o. G53P1011 female, who presents for the evaluation of weight gain. She has gained 5 pounds primarily over 5 months. The patient states the following issues have contributed to her weight problem: decreased time for physical activity.  The patient has no additional symptoms. The patient specifically denies memory loss, muscle weakness, excessive thirst, and polyuria. Weight related co-morbidities include diabetes. . She has tried belviq (no weightloss), contrave (nausea), diethylproprion, phentermine, qsymia (no covered by insurance) interventions in the past with mild to moderate success.   Review of Systems: 10 point review of systems negative unless otherwise noted in HPI  Past Medical History:  Past Medical History:  Diagnosis Date  . 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 Anxiety   . Diabetes mellitus without complication ([place])   . Genital herpes   . Goiter   . High risk human papilloma cervical smear 2015  . History of abnormal cervical Pap smear    POsitive HRHPV  . Insomnia     Past Surgical History:  Past Surgical History:  Procedure Laterality Date  . BIOPSY THYROID  01/18/2014   B9 FNA  . BREAST BIOPSY  08/2010   Left benign   Fibroadenoma  . CESAREAN SECTION  05/2000   Failed ECV..Breech  . CHOLECYSTECTOMY    . COLPOSCOPY    . COMBINED HYSTEROSCOPY DIAGNOSTIC / D&C    . INTRAUTERINE DEVICE INSERTION  08/25/2013    Gynecologic History: No LMP recorded. (Menstrual status: IUD).  Obstetric History: G2P1011  Family History:  Family History  Problem Relation Age of Onset  . Diabetes Maternal Grandmother        Type 2  . Cancer Neg Hx   . Hypertension Neg Hx   . Stroke Neg Hx   . Thyroid disease Neg Hx     Social History:  Social History   Socioeconomic History  . Marital status: Single    Spouse name:  Not on file  . Number of children: 1  . Years of education: Not on file  . Highest education level: Not on file  Occupational History  . Not on file  Social Needs  . Financial resource strain: Not on file  . Food insecurity:    Worry: Not on file    Inability: Not on file  . Transportation needs:    Medical: Not on file    Non-medical: Not on file  Tobacco Use  . Smoking status: Never Smoker  . Smokeless tobacco: Never Used  Substance and Sexual Activity  . Alcohol use: Yes  . Drug use: No  . Sexual activity: Yes    Partners: Male    Birth control/protection: IUD  Lifestyle  . Physical activity:    Days per week: Not on file    Minutes per session: Not on file  . Stress: Not on file  Relationships  . Social connections:    Talks on phone: Not on file    Gets together: Not on file    Attends religious service: Not on file    Active member of club or organization: Not on file    Attends meetings of clubs or organizations: Not on file    Relationship status: Not on file  . Intimate partner violence:    Fear of current or ex partner: Not on file    Emotionally abused:  Not on file    Physically abused: Not on file    Forced sexual activity: Not on file  Other Topics Concern  . Not on file  Social History Narrative  . Not on file    Allergies:  No Known Allergies  Medications: Prior to Admission medications   Medication Sig Start Date End Date Taking? Authorizing Provider  albuterol (PROVENTIL HFA;VENTOLIN HFA) 108 (90 Base) MCG/ACT inhaler Inhale 2 puffs into the lungs every 4 (four) hours as needed for wheezing or shortness of breath. 01/20/16  Yes Cuthriell, Charline Bills, PA-C  Blood Glucose Monitoring Suppl (GLUCOCOM BLOOD GLUCOSE MONITOR) DEVI One Touch Verio meter. Use to check sugar once daily. 12/19/16  Yes [provider]  Dulaglutide 0.75 MG/0.5ML SOPN Inject 0.75 mg into the skin. 05/07/17  Yes [provider]  empagliflozin (JARDIANCE) 10 MG  TABS tablet Take 10 mg by mouth. 05/14/17 05/14/18 Yes [provider]  fluconazole (DIFLUCAN) 150 MG tablet  12/09/17  Yes [provider]  glipiZIDE (GLUCOTROL) 5 MG tablet Take by mouth daily before breakfast.   Yes [provider]  glucose blood ([employer] TEST STRIPS) test strip Use once daily. 02/02/15  Yes [provider]  levonorgestrel (MIRENA) 20 MCG/24HR IUD by Intrauterine route.   Yes [provider]  metFORMIN (GLUCOPHAGE-XR) 500 MG 24 hr tablet Take 500 mg by mouth daily with breakfast.   Yes [provider]  pravastatin (PRAVACHOL) 20 MG tablet Take 20 mg by mouth daily.   Yes [provider]  triamcinolone ointment (KENALOG) 0.1 % Apply topically to itchy bumps twice a day as needed 11/18/17  Yes [provider]    Physical Exam Blood pressure 124/84, pulse (!) 106, height 5' 7"  (1.702 m), weight 198 lb (89.8 kg). No LMP recorded. (Menstrual status: IUD). Filed Weights   01/09/18 1538  Weight: 198 lb (89.8 kg)  Body mass index is 31.01 kg/m.   General: NAD HEENT: normocephalic, anicteric Thyroid: no enlargement Pulmonary: no increased work of breathing Neurologic: Grossly intact Psychiatric: mood appropriate, affect full  Assessment: 47 y.o. G2P1011 presenting for discussion of weight loss management options  Plan: Problem List Items Addressed This Visit    None      1) 1500 Calorie ADA Diet  2) Patient education given regarding appropriate lifestyle changes for weight loss including: regular physical activity, healthy coping strategies, caloric restriction and healthy eating patterns.  3) Patient will be started on weight loss medication. The risks and benefits and side effects of medication, such as Adipex (Phenteramine) ,  Tenuate (Diethylproprion), Belviq (lorcarsin), Contrave (buproprion/naltrexone), Qsymia (phentermine/topiramate), and Saxenda (liraglutide) is discussed. The pros and cons of  suppressing appetite and boosting metabolism is discussed. Risks of tolerence and addiction is discussed for selected agents discussed. Use of medicine will ne short term, such as 3-4 months at a time followed by a period of time off of the medicine to avoid these risks and side effects for Adipex, Qsymia, and Tenuate discussed. Pt to call with any negative side effects and agrees to keep follow up appts.  4) Comorbidity Screening -up to date per PCP  5) Encouraged weekly weight monitorig to track progress and sample 1 week food diary  6) Contraception - discussed that all weight loss drugs fall in to pregnancy category X  7) 15 minutes face-to-face; counseling/coordination of care > 50 percent of visit  8) Return in about 1 month (around 02/06/2018) for wt check.   [name], MD, Cherlynn June  [place], [facility] 01/09/2018, 9:33 PM

## 2018-03-20 ENCOUNTER — Other Ambulatory Visit: Payer: Self-pay | Admitting: Certified Nurse Midwife

## 2018-03-20 DIAGNOSIS — Z1231 Encounter for screening mammogram for malignant neoplasm of breast: Secondary | ICD-10-CM

## 2018-04-08 ENCOUNTER — Ambulatory Visit
Admission: RE | Admit: 2018-04-08 | Discharge: 2018-04-08 | Disposition: A | Discharge status: Home / Self Care | Payer: Commercial Managed Care - PPO | Source: Ambulatory Visit | Attending: Certified Nurse Midwife | Admitting: Certified Nurse Midwife

## 2018-04-08 ENCOUNTER — Telehealth: Payer: Self-pay | Admitting: Obstetrics and Gynecology

## 2018-04-08 DIAGNOSIS — Z1231 Encounter for screening mammogram for malignant neoplasm of breast: Secondary | ICD-10-CM | POA: Diagnosis present

## 2018-04-08 NOTE — Telephone Encounter (Signed)
The patient called and stated that she would like to speak with a nurse in regards to her having to schedule a new patient appointment in October, looking in the patients chart she seems to be a new patient coming in. No other information was disclosed. Please advise.

## 2018-04-09 ENCOUNTER — Telehealth: Payer: Self-pay | Admitting: Obstetrics and Gynecology

## 2018-04-09 NOTE — Telephone Encounter (Signed)
The patient called and stated that she missed a call from Amy, And would like a call back. Please advise.

## 2018-04-10 NOTE — Telephone Encounter (Signed)
appt was made for pt

## 2018-04-18 ENCOUNTER — Encounter: Payer: Self-pay | Admitting: Obstetrics and Gynecology

## 2018-04-18 ENCOUNTER — Ambulatory Visit (INDEPENDENT_AMBULATORY_CARE_PROVIDER_SITE_OTHER): Payer: Commercial Managed Care - PPO | Admitting: Obstetrics and Gynecology

## 2018-04-18 VITALS — BP 113/77 | HR 88 | Ht 67.0 in | Wt 195.6 lb

## 2018-04-18 DIAGNOSIS — Z713 Dietary counseling and surveillance: Secondary | ICD-10-CM

## 2018-04-18 DIAGNOSIS — E669 Obesity, unspecified: Secondary | ICD-10-CM | POA: Diagnosis not present

## 2018-04-18 DIAGNOSIS — Z6830 Body mass index (BMI) 30.0-30.9, adult: Secondary | ICD-10-CM

## 2018-04-18 MED ORDER — CYANOCOBALAMIN 1000 MCG/ML IJ SOLN: 1000.0000 ug | INTRAMUSCULAR | 1 refills | Status: DC

## 2018-04-18 MED ORDER — PHENTERMINE HCL 37.5 MG PO TABS: 37.5000 mg | ORAL_TABLET | Freq: Every day | ORAL | 2 refills | Status: DC

## 2018-04-18 MED ORDER — CYANOCOBALAMIN 1000 MCG/ML IJ SOLN: 1000.0000 ug | INTRAMUSCULAR | 3 refills | Status: DC

## 2018-04-18 NOTE — Progress Notes (Signed)
Subjective:  Jillian Foster is a 47 y.o. G2P1011 at Unknown being seen today for weight loss management- initial visit.  Patient reports General ROS: negative and reports previous weight loss attempts: with adipex working well, but interups sleep cycles. Is walking 2-3 times a week.    Associated symptoms include: fatigue, and change in clothing fit . States diabetes is under good control. Previous/Current treatment includes: small frequent feedings, nutritional supplement, itamin B-12 injections and appetite suppressent .     Past evaluation has included: metabolic profile, hemoglobin A1c, thyroid panel  and psychiatric evaluation.    The following portions of the patient's history were reviewed and updated as appropriate: allergies, current medications, past family history, past medical history, past social history, past surgical history and problem list.   Objective:   Vitals:   04/18/18 0901  BP: 113/77  Pulse: 88  Weight: 195 lb 9.6 oz (88.7 kg)  Height: 5' 7"  (1.702 m)    General:  Alert, oriented and cooperative. Patient is in no acute distress.  :   :   :   :   :   :   PE: Well groomed female in no current distress,   Mental Status: Normal mood and affect. Normal behavior. Normal judgment and thought content.   Current BMI: Body mass index is 30.64 kg/m.   Assessment and Plan:  Obesity  There are no diagnoses linked to this encounter.  Plan: low carb, High protein diet RX for adipex 37.5 mg daily and B12 1018mg.ml monthly, to start now with first injection given at today's visit. Reviewed side-effects common to both medications and expected outcomes. Increase daily water intake to at least 8 bottle a day, every day.  Goal is to reduse weight by 10% by end of three months, and will re-evaluate then.  RTC in 4 weeks for Nurse visit to check weight & BP, and get next B12 injections.    Please refer to After Visit Summary for other counseling  recommendations.    [place] Melody N, CNM   Melody [place]uther CNM      Consider the Low Glycemic Index Diet and 6 smaller meals daily .  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 Managing labor and delivery with postpartum and newborn care in normal and high-risk pregnancies in a hospital setting. Certified and experience in vacuum extraction, first assist in the OR daily, and third- or fourth-degree repairs. Experienced in hydrotherapy and waterbirth along with nitrous oxide use for pain management while in labor. Daily contraceptive management, high risk pap smears with colposcopy, infertility issues, pelvic pain & menopausal care. Possess current DEA License. The only 5 star rated provider at our practice.

## 2018-04-18 NOTE — Patient Instructions (Signed)
    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 Managing labor and delivery with postpartum and newborn care in normal and high-risk pregnancies in a hospital setting. Certified and experience in vacuum extraction, first assist in the OR daily, and third- or fourth-degree repairs. Experienced in hydrotherapy and waterbirth along with nitrous oxide use for pain management while in labor. Daily contraceptive management, high risk pap smears with colposcopy, infertility issues, pelvic pain & menopausal care. Possess current DEA License. The only 5 star rated provider at our practice.

## 2018-05-19 ENCOUNTER — Ambulatory Visit: Payer: Commercial Managed Care - PPO | Admitting: Obstetrics and Gynecology

## 2018-05-23 ENCOUNTER — Ambulatory Visit (INDEPENDENT_AMBULATORY_CARE_PROVIDER_SITE_OTHER): Payer: Commercial Managed Care - PPO | Admitting: Obstetrics and Gynecology

## 2018-05-23 VITALS — BP 117/83 | HR 106 | Ht 67.0 in | Wt 190.8 lb

## 2018-05-23 DIAGNOSIS — E669 Obesity, unspecified: Secondary | ICD-10-CM | POA: Diagnosis not present

## 2018-05-23 MED ORDER — CYANOCOBALAMIN 1000 MCG/ML IJ SOLN
1000.0000 ug | Freq: Once | INTRAMUSCULAR | Status: AC
  Administered 2018-05-23: 1000 ug via INTRAMUSCULAR

## 2018-05-23 NOTE — Progress Notes (Signed)
Pt presents for wt, bp and b12 inj. Weight is down 5# from LV. She notes a dry mouth and a tickle in her throat. Encouraged 64oz of water a day.  Exercise and healthy eating. B 12 used in house. F/u in 1 month.   BP 117/83   Pulse (!) 106   Ht 5' 7"  (1.702 m)   Wt 190 lb 12.8 oz (86.5 kg)   BMI 29.88 kg/m

## 2018-06-13 ENCOUNTER — Other Ambulatory Visit: Payer: Self-pay | Admitting: Obstetrics and Gynecology

## 2018-06-13 MED ORDER — METRONIDAZOLE 0.75 % VA GEL: 1.0000 | Freq: Every day | VAGINAL | 1 refills | Status: AC

## 2018-06-20 ENCOUNTER — Ambulatory Visit (INDEPENDENT_AMBULATORY_CARE_PROVIDER_SITE_OTHER): Payer: Commercial Managed Care - PPO | Admitting: Obstetrics and Gynecology

## 2018-06-20 VITALS — BP 112/80 | HR 108 | Ht 67.0 in | Wt 189.2 lb

## 2018-06-20 DIAGNOSIS — E669 Obesity, unspecified: Secondary | ICD-10-CM | POA: Diagnosis not present

## 2018-06-20 MED ORDER — CYANOCOBALAMIN 1000 MCG/ML IJ SOLN
1000.0000 ug | Freq: Once | INTRAMUSCULAR | Status: AC
  Administered 2018-06-20: 1000 ug via INTRAMUSCULAR

## 2018-06-20 NOTE — Progress Notes (Signed)
Pt presents for  Weight,B/P, B-12 injection. No side effects of medications-Phentermine or B-12. Weight loss __2__ lbs. Encourage eating healthy and exercise.

## 2018-07-18 ENCOUNTER — Ambulatory Visit (INDEPENDENT_AMBULATORY_CARE_PROVIDER_SITE_OTHER): Payer: Commercial Managed Care - PPO | Admitting: Obstetrics and Gynecology

## 2018-07-18 ENCOUNTER — Encounter: Payer: Self-pay | Admitting: Obstetrics and Gynecology

## 2018-07-18 DIAGNOSIS — E119 Type 2 diabetes mellitus without complications: Secondary | ICD-10-CM | POA: Diagnosis not present

## 2018-07-18 MED ORDER — PHENTERMINE HCL 37.5 MG PO TABS: 37.5000 mg | ORAL_TABLET | Freq: Every day | ORAL | 2 refills | Status: DC

## 2018-07-18 NOTE — Progress Notes (Signed)
SUBJECTIVE:  47 y.o. here for follow-up weight loss visit, previously seen 4 weeks ago. Has lost 8#s since restarting weight loss medications in July 2019. Denies any concerns and feels like medication is working well and desires continuing on it. Also needs mirena replaced next month.  OBJECTIVE:  BP 128/90   Pulse (!) 103   Ht 5' 7"  (1.702 m)   Wt 187 lb 12.8 oz (85.2 kg)   BMI 29.41 kg/m   Body mass index is 29.41 kg/m. Patient appears well. Waist 30"  ASSESSMENT:  Obesity- responding well to weight loss plan  PLAN:  To continue with current medications. B12 1033mg/ml injection given. Understands needs to lose at least 5% of weight obver next 2 months to continue on medication longer. RTC in 4 weeks as planned but will add Mirena replacement to the appointment.  [place] CNM

## 2018-08-12 ENCOUNTER — Encounter: Payer: Self-pay | Admitting: Obstetrics and Gynecology

## 2018-08-21 ENCOUNTER — Other Ambulatory Visit: Payer: Self-pay | Admitting: Obstetrics and Gynecology

## 2018-08-21 ENCOUNTER — Encounter: Payer: Self-pay | Admitting: Obstetrics and Gynecology

## 2018-08-21 ENCOUNTER — Ambulatory Visit (INDEPENDENT_AMBULATORY_CARE_PROVIDER_SITE_OTHER): Payer: Commercial Managed Care - PPO | Admitting: Obstetrics and Gynecology

## 2018-08-21 ENCOUNTER — Other Ambulatory Visit (INDEPENDENT_AMBULATORY_CARE_PROVIDER_SITE_OTHER): Payer: Commercial Managed Care - PPO

## 2018-08-21 VITALS — BP 132/90 | HR 100 | Ht 67.0 in | Wt 186.5 lb

## 2018-08-21 DIAGNOSIS — T8332XA Displacement of intrauterine contraceptive device, initial encounter: Secondary | ICD-10-CM

## 2018-08-21 DIAGNOSIS — Z30430 Encounter for insertion of intrauterine contraceptive device: Secondary | ICD-10-CM

## 2018-08-21 DIAGNOSIS — Z30432 Encounter for removal of intrauterine contraceptive device: Secondary | ICD-10-CM

## 2018-08-21 DIAGNOSIS — Z538 Procedure and treatment not carried out for other reasons: Secondary | ICD-10-CM

## 2018-08-21 DIAGNOSIS — Z30431 Encounter for routine checking of intrauterine contraceptive device: Secondary | ICD-10-CM | POA: Diagnosis not present

## 2018-08-21 DIAGNOSIS — Z975 Presence of (intrauterine) contraceptive device: Principal | ICD-10-CM

## 2018-08-21 NOTE — Progress Notes (Signed)
Jillian Foster is a 47 y.o. year old G79P1011 African American female who presents for removal and replacement of a Mirena IUD. She was given informed consent for removal and reinsertion of her Mirena. Her Mirena was placed 2014, No LMP recorded. (Menstrual status: IUD)., and her pregnancy test today was negative.   The risks and benefits of the method and placement have been thouroughly reviewed with the patient and all questions were answered.  Specifically the patient is aware of failure rate of 10/998, expulsion of the IUD and of possible perforation.  The patient is aware of irregular bleeding due to the method and understands the incidence of irregular bleeding diminishes with time.  Signed copy of informed consent in chart.   No LMP recorded. (Menstrual status: IUD). BP 132/90   Pulse 100   Ht 5' 7"  (1.702 m)   Wt 186 lb 8 oz (84.6 kg)   BMI 29.21 kg/m  No results found for this or any previous visit (from the past 24 hour(s)).   Appropriate time out taken. A pederson speculum was placed in the vagina.  The cervix was visualized, prepped using Betadine. The strings were not visible.  The cervix was then grasped with a single-tooth tenaculum.the crevix was stenotic and dilators used with good dilation, but unable to dilate enough to get IUD retrival device through the cervix.    Sonogram was performed and the location of the IUD was verified via transvaginal u/s. With strings visible. (see ultrasound report)  Counseled on findings and patients returning on a different day to try again to replace IUD. Discussed with Dr Marcelline Mates and will have her attempt it under ultrasound guidance.      Jillian Foster, CNM

## 2018-09-17 ENCOUNTER — Encounter: Payer: Self-pay | Admitting: *Deleted

## 2018-09-19 ENCOUNTER — Encounter: Payer: Commercial Managed Care - PPO | Admitting: *Deleted

## 2018-09-22 ENCOUNTER — Encounter: Payer: Self-pay | Admitting: Obstetrics and Gynecology

## 2018-09-22 ENCOUNTER — Ambulatory Visit: Payer: Commercial Managed Care - PPO | Admitting: Obstetrics and Gynecology

## 2018-10-09 ENCOUNTER — Inpatient Hospital Stay: Admission: RE | Admit: 2018-10-09 | Payer: Commercial Managed Care - PPO | Source: Ambulatory Visit

## 2018-10-09 ENCOUNTER — Other Ambulatory Visit: Payer: Self-pay | Admitting: Obstetrics and Gynecology

## 2018-10-09 NOTE — H&P (View-Only) (Signed)
GYNECOLOGY PRE-OPERATIVE HISTORY AND PHYSICAL Subjective:  Jillian Foster is a 47 y.o. G2P1011 here for surgical management of lost IUD threads, cervical stenosis, and desiring new IUD.  No significant preoperative concerns.  Proposed surgery: Hysteroscopic IUD retrieval with new IUD insertion    Pertinent Gynecological History: Menses: not having menses on IUD Contraception: IUD (Mirena) Last pap: normal Date: 06/2017   Past Medical History:  Diagnosis Date  . 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 Anxiety   . Diabetes mellitus without complication ([place])   . Genital herpes   . Goiter   . High risk human papilloma cervical smear 2015  . History of abnormal cervical Pap smear    POsitive HRHPV  . Insomnia    Past Surgical History:  Procedure Laterality Date  . BIOPSY THYROID  01/18/2014   B9 FNA  . BREAST BIOPSY  08/2010   Left benign   Fibroadenoma  . CESAREAN SECTION  05/2000   Failed ECV..Breech  . CHOLECYSTECTOMY    . COLPOSCOPY    . COMBINED HYSTEROSCOPY DIAGNOSTIC / D&C    . INTRAUTERINE DEVICE INSERTION  08/25/2013   OB History  Gravida Para Term Preterm AB Living  2 1 1   1 1   SAB TAB Ectopic Multiple Live Births          1    # Outcome Date GA Lbr Len/2nd Weight Sex Delivery Anes PTL Lv  2 Term 05/16/00   8 lb 2 oz (3.685 kg) F CS-LTranv  N LIV     Complications: Breech birth  1 AB             Family History  Problem Relation Age of Onset  . Diabetes Maternal Grandmother        Type 2  . Cancer Neg Hx   . Hypertension Neg Hx   . Stroke Neg Hx   . Thyroid disease Neg Hx   . Breast cancer Neg Hx     Social History   Socioeconomic History  . Marital status: Single    Spouse name: Not on file  . Number of children: 1  . Years of education: Not on file  . Highest education level: Not on file  Occupational History  . Not on file  Social Needs  . Financial resource strain: Not on file  . Food insecurity:    Worry: Not on file   Inability: Not on file  . Transportation needs:    Medical: Not on file    Non-medical: Not on file  Tobacco Use  . Smoking status: Never Smoker  . Smokeless tobacco: Never Used  Substance and Sexual Activity  . Alcohol use: Yes  . Drug use: No  . Sexual activity: Yes    Partners: Male    Birth control/protection: I.U.D.  Lifestyle  . Physical activity:    Days per week: Not on file    Minutes per session: Not on file  . Stress: Not on file  Relationships  . Social connections:    Talks on phone: Not on file    Gets together: Not on file    Attends religious service: Not on file    Active member of club or organization: Not on file    Attends meetings of clubs or organizations: Not on file    Relationship status: Not on file  . Intimate partner violence:    Fear of current or ex partner: Not on file    Emotionally  abused: Not on file    Physically abused: Not on file    Forced sexual activity: Not on file  Other Topics Concern  . Not on file  Social History Narrative  . Not on file    Current Outpatient Medications on File Prior to Visit  Medication Sig Dispense Refill  . Blood Glucose Monitoring Suppl (GLUCOCOM BLOOD GLUCOSE MONITOR) DEVI One Touch Verio meter. Use to check sugar once daily.    . cyanocobalamin (,VITAMIN B-12,) 1000 MCG/ML injection Inject 1 mL (1,000 mcg total) into the muscle every 30 (thirty) days. 1 mL 3  . Dulaglutide (TRULICITY) 3.50 KX/3.8HW SOPN Inject 0.75 mg into the skin every Tuesday.     . empagliflozin (JARDIANCE) 25 MG TABS tablet Take 25 mg by mouth daily.    Marland Kitchen glipiZIDE (GLUCOTROL XL) 2.5 MG 24 hr tablet Take 2.5 mg by mouth daily with breakfast.    . glucose blood ([employer] TEST STRIPS) test strip Use once daily.    . Ibuprofen-diphenhydrAMINE HCl (ADVIL PM) 200-25 MG CAPS Take 1 tablet by mouth at bedtime as needed (sleep/pain.).    Marland Kitchen levonorgestrel (MIRENA) 20 MCG/24HR IUD by Intrauterine route.    . phentermine (ADIPEX-P) 37.5 MG  tablet Take 1 tablet (37.5 mg total) by mouth daily before breakfast. 30 tablet 2  . pravastatin (PRAVACHOL) 20 MG tablet Take 20 mg by mouth daily.     No current facility-administered medications on file prior to visit.     No Known Allergies    Review of Systems Constitutional: No recent fever/chills/sweats Respiratory: No recent cough/bronchitis Cardiovascular: No chest pain Gastrointestinal: No recent nausea/vomiting/diarrhea Genitourinary: No UTI symptoms Hematologic/lymphatic:No history of coagulopathy or recent blood thinner use    Objective:   There were no vitals taken for this visit. CONSTITUTIONAL: Well-developed, well-nourished female in no acute distress.  HENT:  Normocephalic, atraumatic, External right and left ear normal. Oropharynx is clear and moist EYES: Conjunctivae and EOM are normal. Pupils are equal, round, and reactive to light. No scleral icterus.  NECK: Normal range of motion, supple, no masses SKIN: Skin is warm and dry. No rash noted. Not diaphoretic. No erythema. No pallor. NEUROLOGIC: Alert and oriented to person, place, and time. Normal reflexes, muscle tone coordination. No cranial nerve deficit noted. PSYCHIATRIC: Normal mood and affect. Normal behavior. Normal judgment and thought content. CARDIOVASCULAR: Normal heart rate noted, regular rhythm RESPIRATORY: Effort and breath sounds normal, no problems with respiration noted ABDOMEN: Soft, nontender, nondistended. PELVIC: Deferred MUSCULOSKELETAL: Normal range of motion. No edema and no tenderness. 2+ distal pulses.    Labs: No results found for this or any previous visit (from the past 336 hour(s)).  Imaging Studies:  US PELVIS TRANSVANGINAL NON-OB (TV ONLY) ULTRASOUND REPORT  Location: [facility] Date of Service:  08/21/2018  Indications: IUD check; Strings lost Findings:  The uterus measures 7.5 x 3.4 x 3.4 cm. Echo texture is grossly homogeneous with evidence of focal  masses. Within the uterus are multiple suspected fibroids measuring: Fibroid 1: Mid Anterior, Intramural, 1.1 x 1.4 x 0.9 cm - Possibly  abutting the endometrium  The Endometrium appears thickened and measures 9.4 mm. The IUD appears to be in the correct location within the endometrium.  The  strings are visualized within the cervix.  Right Ovary measures 3.2 x 1.7 x 1.7 cm. It is normal in appearance. Left Ovary measures 2.2 x 1.4 x 1.2 cm. It is normal appearance. Survey of the adnexa demonstrates no adnexal masses. There  is no free fluid in the cul de sac.  Impression: 1. Anteverted fibroid uterus 2. Endometrium appears thickened and measures 9.4 mm. 3. Bilateral ovaries appear WNL. 4. IUD appears in the correct location with strings visible within the  cervix.  Recommendations: 1.Clinical correlation with the patient's History and Physical Exam.  Dario Ave, RDMS  I have reviewed this study and agree with documented findings.   Rubie Maid, MD [facility]   Assessment:    Lost IUD threads Cervical stenosis Desires IUD for contraception   Plan:    Counseling: Procedure, risks, reasons, benefits and complications (including injury to bowel, bladder, major blood vessel, ureter, bleeding, possibility of transfusion, infection, or fistula formation) reviewed in detail. Likelihood of success in alleviating the patient's condition was discussed. Routine postoperative instructions will be reviewed with the patient and her family in detail after surgery.  The patient concurred with the proposed plan, giving informed written consent for the surgery.   Preop testing ordered. Instructions reviewed, including NPO after midnight.   Rubie Maid, MD [facility]

## 2018-10-09 NOTE — H&P (Signed)
GYNECOLOGY PRE-OPERATIVE HISTORY AND PHYSICAL Subjective:  Jillian Foster is a 47 y.o. G2P1011 here for surgical management of lost IUD threads, cervical stenosis, and desiring new IUD.  No significant preoperative concerns.  Proposed surgery: Hysteroscopic IUD retrieval with new IUD insertion    Pertinent Gynecological History: Menses: not having menses on IUD Contraception: IUD (Mirena) Last pap: normal Date: 06/2017   Past Medical History:  Diagnosis Date  . 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 Anxiety   . Diabetes mellitus without complication ([place])   . Genital herpes   . Goiter   . High risk human papilloma cervical smear 2015  . History of abnormal cervical Pap smear    POsitive HRHPV  . Insomnia    Past Surgical History:  Procedure Laterality Date  . BIOPSY THYROID  01/18/2014   B9 FNA  . BREAST BIOPSY  08/2010   Left benign   Fibroadenoma  . CESAREAN SECTION  05/2000   Failed ECV..Breech  . CHOLECYSTECTOMY    . COLPOSCOPY    . COMBINED HYSTEROSCOPY DIAGNOSTIC / D&C    . INTRAUTERINE DEVICE INSERTION  08/25/2013   OB History  Gravida Para Term Preterm AB Living  2 1 1   1 1   SAB TAB Ectopic Multiple Live Births          1    # Outcome Date GA Lbr Len/2nd Weight Sex Delivery Anes PTL Lv  2 Term 05/16/00   8 lb 2 oz (3.685 kg) F CS-LTranv  N LIV     Complications: Breech birth  1 AB             Family History  Problem Relation Age of Onset  . Diabetes Maternal Grandmother        Type 2  . Cancer Neg Hx   . Hypertension Neg Hx   . Stroke Neg Hx   . Thyroid disease Neg Hx   . Breast cancer Neg Hx     Social History   Socioeconomic History  . Marital status: Single    Spouse name: Not on file  . Number of children: 1  . Years of education: Not on file  . Highest education level: Not on file  Occupational History  . Not on file  Social Needs  . Financial resource strain: Not on file  . Food insecurity:    Worry: Not on file   Inability: Not on file  . Transportation needs:    Medical: Not on file    Non-medical: Not on file  Tobacco Use  . Smoking status: Never Smoker  . Smokeless tobacco: Never Used  Substance and Sexual Activity  . Alcohol use: Yes  . Drug use: No  . Sexual activity: Yes    Partners: Male    Birth control/protection: I.U.D.  Lifestyle  . Physical activity:    Days per week: Not on file    Minutes per session: Not on file  . Stress: Not on file  Relationships  . Social connections:    Talks on phone: Not on file    Gets together: Not on file    Attends religious service: Not on file    Active member of club or organization: Not on file    Attends meetings of clubs or organizations: Not on file    Relationship status: Not on file  . Intimate partner violence:    Fear of current or ex partner: Not on file    Emotionally  abused: Not on file    Physically abused: Not on file    Forced sexual activity: Not on file  Other Topics Concern  . Not on file  Social History Narrative  . Not on file    Current Outpatient Medications on File Prior to Visit  Medication Sig Dispense Refill  . Blood Glucose Monitoring Suppl (GLUCOCOM BLOOD GLUCOSE MONITOR) DEVI One Touch Verio meter. Use to check sugar once daily.    . cyanocobalamin (,VITAMIN B-12,) 1000 MCG/ML injection Inject 1 mL (1,000 mcg total) into the muscle every 30 (thirty) days. 1 mL 3  . Dulaglutide (TRULICITY) 0.62 IR/4.8NI SOPN Inject 0.75 mg into the skin every Tuesday.     . empagliflozin (JARDIANCE) 25 MG TABS tablet Take 25 mg by mouth daily.    Marland Kitchen glipiZIDE (GLUCOTROL XL) 2.5 MG 24 hr tablet Take 2.5 mg by mouth daily with breakfast.    . glucose blood ([employer] TEST STRIPS) test strip Use once daily.    . Ibuprofen-diphenhydrAMINE HCl (ADVIL PM) 200-25 MG CAPS Take 1 tablet by mouth at bedtime as needed (sleep/pain.).    Marland Kitchen levonorgestrel (MIRENA) 20 MCG/24HR IUD by Intrauterine route.    . phentermine (ADIPEX-P) 37.5 MG  tablet Take 1 tablet (37.5 mg total) by mouth daily before breakfast. 30 tablet 2  . pravastatin (PRAVACHOL) 20 MG tablet Take 20 mg by mouth daily.     No current facility-administered medications on file prior to visit.     No Known Allergies    Review of Systems Constitutional: No recent fever/chills/sweats Respiratory: No recent cough/bronchitis Cardiovascular: No chest pain Gastrointestinal: No recent nausea/vomiting/diarrhea Genitourinary: No UTI symptoms Hematologic/lymphatic:No history of coagulopathy or recent blood thinner use    Objective:   There were no vitals taken for this visit. CONSTITUTIONAL: Well-developed, well-nourished female in no acute distress.  HENT:  Normocephalic, atraumatic, External right and left ear normal. Oropharynx is clear and moist EYES: Conjunctivae and EOM are normal. Pupils are equal, round, and reactive to light. No scleral icterus.  NECK: Normal range of motion, supple, no masses SKIN: Skin is warm and dry. No rash noted. Not diaphoretic. No erythema. No pallor. NEUROLOGIC: Alert and oriented to person, place, and time. Normal reflexes, muscle tone coordination. No cranial nerve deficit noted. PSYCHIATRIC: Normal mood and affect. Normal behavior. Normal judgment and thought content. CARDIOVASCULAR: Normal heart rate noted, regular rhythm RESPIRATORY: Effort and breath sounds normal, no problems with respiration noted ABDOMEN: Soft, nontender, nondistended. PELVIC: Deferred MUSCULOSKELETAL: Normal range of motion. No edema and no tenderness. 2+ distal pulses.    Labs: No results found for this or any previous visit (from the past 336 hour(s)).  Imaging Studies:  US PELVIS TRANSVANGINAL NON-OB (TV ONLY) ULTRASOUND REPORT  Location: [facility] Date of Service:  08/21/2018  Indications: IUD check; Strings lost Findings:  The uterus measures 7.5 x 3.4 x 3.4 cm. Echo texture is grossly homogeneous with evidence of focal  masses. Within the uterus are multiple suspected fibroids measuring: Fibroid 1: Mid Anterior, Intramural, 1.1 x 1.4 x 0.9 cm - Possibly  abutting the endometrium  The Endometrium appears thickened and measures 9.4 mm. The IUD appears to be in the correct location within the endometrium.  The  strings are visualized within the cervix.  Right Ovary measures 3.2 x 1.7 x 1.7 cm. It is normal in appearance. Left Ovary measures 2.2 x 1.4 x 1.2 cm. It is normal appearance. Survey of the adnexa demonstrates no adnexal masses. There  is no free fluid in the cul de sac.  Impression: 1. Anteverted fibroid uterus 2. Endometrium appears thickened and measures 9.4 mm. 3. Bilateral ovaries appear WNL. 4. IUD appears in the correct location with strings visible within the  cervix.  Recommendations: 1.Clinical correlation with the patient's History and Physical Exam.  Dario Ave, RDMS  I have reviewed this study and agree with documented findings.   Rubie Maid, MD [facility]   Assessment:    Lost IUD threads Cervical stenosis Desires IUD for contraception   Plan:    Counseling: Procedure, risks, reasons, benefits and complications (including injury to bowel, bladder, major blood vessel, ureter, bleeding, possibility of transfusion, infection, or fistula formation) reviewed in detail. Likelihood of success in alleviating the patient's condition was discussed. Routine postoperative instructions will be reviewed with the patient and her family in detail after surgery.  The patient concurred with the proposed plan, giving informed written consent for the surgery.   Preop testing ordered. Instructions reviewed, including NPO after midnight.   Rubie Maid, MD [facility]

## 2018-10-10 ENCOUNTER — Ambulatory Visit
Admission: RE | Admit: 2018-10-10 | Discharge: 2018-10-10 | Disposition: A | Discharge status: Home / Self Care | Payer: Commercial Managed Care - PPO | Attending: Obstetrics and Gynecology | Admitting: Obstetrics and Gynecology

## 2018-10-10 ENCOUNTER — Ambulatory Visit: Payer: Commercial Managed Care - PPO | Admitting: Certified Registered"

## 2018-10-10 ENCOUNTER — Other Ambulatory Visit: Payer: Self-pay

## 2018-10-10 ENCOUNTER — Encounter
Admission: RE | Disposition: A | Discharge status: Home / Self Care | Payer: Self-pay | Source: Home / Self Care | Attending: Obstetrics and Gynecology

## 2018-10-10 DIAGNOSIS — Z30433 Encounter for removal and reinsertion of intrauterine contraceptive device: Secondary | ICD-10-CM

## 2018-10-10 DIAGNOSIS — Z791 Long term (current) use of non-steroidal anti-inflammatories (NSAID): Secondary | ICD-10-CM | POA: Diagnosis not present

## 2018-10-10 DIAGNOSIS — Z793 Long term (current) use of hormonal contraceptives: Secondary | ICD-10-CM | POA: Insufficient documentation

## 2018-10-10 DIAGNOSIS — Y768 Miscellaneous obstetric and gynecological devices associated with adverse incidents, not elsewhere classified: Secondary | ICD-10-CM | POA: Diagnosis not present

## 2018-10-10 DIAGNOSIS — F419 Anxiety disorder, unspecified: Secondary | ICD-10-CM | POA: Diagnosis not present

## 2018-10-10 DIAGNOSIS — E119 Type 2 diabetes mellitus without complications: Secondary | ICD-10-CM | POA: Diagnosis not present

## 2018-10-10 DIAGNOSIS — G4700 Insomnia, unspecified: Secondary | ICD-10-CM | POA: Insufficient documentation

## 2018-10-10 DIAGNOSIS — Z7984 Long term (current) use of oral hypoglycemic drugs: Secondary | ICD-10-CM | POA: Diagnosis not present

## 2018-10-10 DIAGNOSIS — Z79899 Other long term (current) drug therapy: Secondary | ICD-10-CM | POA: Insufficient documentation

## 2018-10-10 DIAGNOSIS — T8332XA Displacement of intrauterine contraceptive device, initial encounter: Secondary | ICD-10-CM | POA: Diagnosis not present

## 2018-10-10 DIAGNOSIS — N882 Stricture and stenosis of cervix uteri: Secondary | ICD-10-CM | POA: Diagnosis not present

## 2018-10-10 HISTORY — PX: IUD REMOVAL: SHX5392

## 2018-10-10 HISTORY — PX: INTRAUTERINE DEVICE (IUD) INSERTION: SHX5877

## 2018-10-10 LAB — CBC
HCT: 43.7 % (ref 36.0–46.0)
Hemoglobin: 14 g/dL (ref 12.0–15.0)
MCH: 27.6 pg (ref 26.0–34.0)
MCHC: 32 g/dL (ref 30.0–36.0)
MCV: 86 fL (ref 80.0–100.0)
Platelets: 271 10*3/uL (ref 150–400)
RBC: 5.08 MIL/uL (ref 3.87–5.11)
RDW: 13.3 % (ref 11.5–15.5)
WBC: 5.7 10*3/uL (ref 4.0–10.5)
nRBC: 0 % (ref 0.0–0.2)

## 2018-10-10 LAB — POCT PREGNANCY, URINE: Preg Test, Ur: NEGATIVE

## 2018-10-10 LAB — GLUCOSE, CAPILLARY
Glucose-Capillary: 120 mg/dL — ABNORMAL HIGH (ref 70–99)
Glucose-Capillary: 85 mg/dL (ref 70–99)

## 2018-10-10 SURGERY — REMOVAL, INTRAUTERINE DEVICE
Anesthesia: General

## 2018-10-10 MED ORDER — DEXAMETHASONE SODIUM PHOSPHATE 10 MG/ML IJ SOLN
INTRAMUSCULAR | Status: DC | PRN
  Administered 2018-10-10: 4 mg via INTRAVENOUS

## 2018-10-10 MED ORDER — ACETAMINOPHEN 10 MG/ML IV SOLN
INTRAVENOUS | Status: DC | PRN
  Administered 2018-10-10: 1000 mg via INTRAVENOUS

## 2018-10-10 MED ORDER — DEXAMETHASONE SODIUM PHOSPHATE 10 MG/ML IJ SOLN
INTRAMUSCULAR | Status: AC
  Filled 2018-10-10: qty 1

## 2018-10-10 MED ORDER — FAMOTIDINE 20 MG PO TABS
20.0000 mg | ORAL_TABLET | Freq: Once | ORAL | Status: AC
  Administered 2018-10-10: 20 mg via ORAL

## 2018-10-10 MED ORDER — MIDAZOLAM HCL 2 MG/2ML IJ SOLN
INTRAMUSCULAR | Status: AC
  Filled 2018-10-10: qty 2

## 2018-10-10 MED ORDER — LACTATED RINGERS IV SOLN: INTRAVENOUS | Status: DC

## 2018-10-10 MED ORDER — LIDOCAINE HCL (CARDIAC) PF 100 MG/5ML IV SOSY
PREFILLED_SYRINGE | INTRAVENOUS | Status: DC | PRN
  Administered 2018-10-10: 80 mg via INTRAVENOUS

## 2018-10-10 MED ORDER — FENTANYL CITRATE (PF) 100 MCG/2ML IJ SOLN: 25.0000 ug | INTRAMUSCULAR | Status: DC | PRN

## 2018-10-10 MED ORDER — IBUPROFEN 800 MG PO TABS: 800.0000 mg | ORAL_TABLET | Freq: Three times a day (TID) | ORAL | 1 refills | Status: AC | PRN

## 2018-10-10 MED ORDER — SILVER NITRATE-POT NITRATE 75-25 % EX MISC
CUTANEOUS | Status: AC
  Filled 2018-10-10: qty 1

## 2018-10-10 MED ORDER — MIDAZOLAM HCL 2 MG/2ML IJ SOLN
INTRAMUSCULAR | Status: DC | PRN
  Administered 2018-10-10: 2 mg via INTRAVENOUS

## 2018-10-10 MED ORDER — PROPOFOL 10 MG/ML IV BOLUS
INTRAVENOUS | Status: DC | PRN
  Administered 2018-10-10: 150 mg via INTRAVENOUS

## 2018-10-10 MED ORDER — LIDOCAINE HCL (PF) 2 % IJ SOLN
INTRAMUSCULAR | Status: AC
  Filled 2018-10-10: qty 10

## 2018-10-10 MED ORDER — ONDANSETRON HCL 4 MG/2ML IJ SOLN
INTRAMUSCULAR | Status: DC | PRN
  Administered 2018-10-10: 4 mg via INTRAVENOUS

## 2018-10-10 MED ORDER — ONDANSETRON HCL 4 MG/2ML IJ SOLN
INTRAMUSCULAR | Status: AC
  Filled 2018-10-10: qty 2

## 2018-10-10 MED ORDER — ACETAMINOPHEN 10 MG/ML IV SOLN
INTRAVENOUS | Status: AC
  Filled 2018-10-10: qty 100

## 2018-10-10 MED ORDER — FENTANYL CITRATE (PF) 100 MCG/2ML IJ SOLN
INTRAMUSCULAR | Status: DC | PRN
  Administered 2018-10-10 (×2): 50 ug via INTRAVENOUS

## 2018-10-10 MED ORDER — SODIUM CHLORIDE 0.9 % IV SOLN
INTRAVENOUS | Status: DC
  Administered 2018-10-10: 11:00:00 via INTRAVENOUS

## 2018-10-10 MED ORDER — ONDANSETRON HCL 4 MG/2ML IJ SOLN: 4.0000 mg | Freq: Once | INTRAMUSCULAR | Status: DC | PRN

## 2018-10-10 MED ORDER — FENTANYL CITRATE (PF) 100 MCG/2ML IJ SOLN
INTRAMUSCULAR | Status: AC
  Filled 2018-10-10: qty 2

## 2018-10-10 MED ORDER — FAMOTIDINE 20 MG PO TABS
ORAL_TABLET | ORAL | Status: AC
  Administered 2018-10-10: 20 mg via ORAL
  Filled 2018-10-10: qty 1

## 2018-10-10 MED ORDER — PROPOFOL 10 MG/ML IV BOLUS
INTRAVENOUS | Status: AC
  Filled 2018-10-10: qty 20

## 2018-10-10 SURGICAL SUPPLY — 21 items
BAG INFUSER PRESSURE 100CC (MISCELLANEOUS) ×2 IMPLANT
CATH ROBINSON RED A/P 16FR (CATHETERS) ×2 IMPLANT
COVER WAND RF STERILE (DRAPES) IMPLANT
CUP MEDICINE 2OZ PLAST GRAD ST (MISCELLANEOUS) ×2 IMPLANT
DRAPE UNDER BUTTOCK W/FLU (DRAPES) ×2 IMPLANT
DRSG TELFA 3X8 NADH (GAUZE/BANDAGES/DRESSINGS) ×2 IMPLANT
GAUZE 4X4 16PLY RFD (DISPOSABLE) ×2 IMPLANT
GLOVE BIO SURGEON STRL SZ 6.5 (GLOVE) ×2 IMPLANT
GLOVE INDICATOR 7.0 STRL GRN (GLOVE) ×6 IMPLANT
GOWN STRL REUS W/ TWL LRG LVL3 (GOWN DISPOSABLE) ×2 IMPLANT
GOWN STRL REUS W/TWL LRG LVL3 (GOWN DISPOSABLE) ×2
IV LACTATED RINGERS 1000ML (IV SOLUTION) ×2 IMPLANT
KIT TURNOVER CYSTO (KITS) ×2 IMPLANT
MIRENA ×2 IMPLANT
PACK DNC HYST (MISCELLANEOUS) ×2 IMPLANT
PAD OB MATERNITY 4.3X12.25 (PERSONAL CARE ITEMS) ×2 IMPLANT
PAD PREP 24X41 OB/GYN DISP (PERSONAL CARE ITEMS) ×2 IMPLANT
SOL PREP PVP 2OZ (MISCELLANEOUS) ×2
SOLUTION PREP PVP 2OZ (MISCELLANEOUS) ×1 IMPLANT
SURGILUBE 2OZ TUBE FLIPTOP (MISCELLANEOUS) ×2 IMPLANT
TOWEL OR 17X26 4PK STRL BLUE (TOWEL DISPOSABLE) ×2 IMPLANT

## 2018-10-10 NOTE — Interval H&P Note (Signed)
History and Physical Interval Note:  10/10/2018 12:31 PM  Jillian Foster  has presented today for surgery, with the diagnosis of [place] IUD, IUD THREADS LOST, CERVICAL STENOSIS.  The various methods of treatment have been discussed with the patient and family. After consideration of risks, benefits and other options for treatment, the patient has consented to  Procedure(s): INTRAUTERINE DEVICE (IUD) REMOVAL (N/A) HYSTEROSCOPY (N/A) INTRAUTERINE DEVICE (IUD) INSERTION (N/A) as a surgical intervention .  The patient's history has been reviewed, patient examined, no change in status, stable for surgery.  I have reviewed the patient's chart and labs.  Questions were answered to the patient's satisfaction.     Rubie Maid, MD [facility]

## 2018-10-10 NOTE — Discharge Instructions (Addendum)
Intrauterine Device Insertion, Care After  This sheet gives you information about how to care for yourself after your procedure. Your health care provider may also give you more specific instructions. If you have problems or questions, contact your health care provider. What can I expect after the procedure? After the procedure, it is common to have:  Cramps and pain in the abdomen.  Light bleeding (spotting) or heavier bleeding that is like your menstrual period. This may last for up to a few days.  Lower back pain.  Dizziness.  Headaches.  Nausea. Follow these instructions at home:  Before resuming sexual activity, check to make sure that you can feel the IUD string(s). You should be able to feel the end of the string(s) below the opening of your cervix. If your IUD string is in place, you may resume sexual activity. ? If you had a hormonal IUD inserted more than 7 days after your most recent period started, you will need to use a backup method of birth control for 7 days after IUD insertion. Ask your health care provider whether this applies to you.  Continue to check that the IUD is still in place by feeling for the string(s) after every menstrual period, or once a month.  Take over-the-counter and prescription medicines only as told by your health care provider.  Do not drive or use heavy machinery while taking prescription pain medicine.  Keep all follow-up visits as told by your health care provider. This is important. Contact a health care provider if:  You have bleeding that is heavier or lasts longer than a normal menstrual cycle.  You have a fever.  You have cramps or abdominal pain that get worse or do not get better with medicine.  You develop abdominal pain that is new or is not in the same area of earlier cramping and pain.  You feel lightheaded or weak.  You have abnormal or bad-smelling discharge from your vagina.  You have pain during sexual  activity.  You have any of the following problems with your IUD string(s): ? The string bothers or hurts you or your sexual partner. ? You cannot feel the string. ? The string has gotten longer.  You can feel the IUD in your vagina.  You think you may be pregnant, or you miss your menstrual period.  You think you may have an STI (sexually transmitted infection). Get help right away if:  You have flu-like symptoms.  You have a fever and chills.  You can feel that your IUD has slipped out of place. Summary  After the procedure, it is common to have cramps and pain in the abdomen. It is also common to have light bleeding (spotting) or heavier bleeding that is like your menstrual period.  Continue to check that the IUD is still in place by feeling for the string(s) after every menstrual period, or once a month.  Keep all follow-up visits as told by your health care provider. This is important.  Contact your health care provider if you have problems with your IUD string(s), such as the string getting longer or bothering you or your sexual partner. This information is not intended to replace advice given to you by your health care provider. Make sure you discuss any questions you have with your health care provider. Document Released: 05/30/2011 Document Revised: 08/22/2016 Document Reviewed: 08/22/2016 Elsevier Interactive Patient Education  2019 [place]   1) The drugs that you  were given will stay in your system until tomorrow so for the next 24 hours you should not:  A) Drive an automobile B) Make any legal decisions C) Drink any alcoholic beverage   2) You may resume regular meals tomorrow.  Today it is better to start with liquids and gradually work up to solid foods.  You may eat anything you prefer, but it is better to start with liquids, then soup and crackers, and gradually work up to solid foods.   3) Please notify  your doctor immediately if you have any unusual bleeding, trouble breathing, redness and pain at the surgery site, drainage, fever, or pain not relieved by medication.    4) Additional Instructions:        Please contact your physician with any problems or Same Day Surgery at [phone], Monday through Friday 6 am to 4 pm, or [facility] number at [phone].

## 2018-10-10 NOTE — Anesthesia Procedure Notes (Signed)
Procedure Name: LMA Insertion Performed by: Lance Muss, CRNA Pre-anesthesia Checklist: Patient identified, Patient being monitored, Timeout performed, Emergency Drugs available and Suction available Patient Re-evaluated:Patient Re-evaluated prior to induction Oxygen Delivery Method: Circle system utilized Preoxygenation: Pre-oxygenation with 100% oxygen Induction Type: IV induction Ventilation: Mask ventilation without difficulty LMA: LMA inserted LMA Size: 3.5 Tube type: Oral Number of attempts: 1 Placement Confirmation: positive ETCO2 and breath sounds checked- equal and bilateral Tube secured with: Tape Dental Injury: Teeth and Oropharynx as per pre-operative assessment

## 2018-10-10 NOTE — Anesthesia Post-op Follow-up Note (Signed)
Anesthesia QCDR form completed.        

## 2018-10-10 NOTE — Transfer of Care (Signed)
Immediate Anesthesia Transfer of Care Note  Patient: Jillian Foster  Procedure(s) Performed: INTRAUTERINE DEVICE (IUD) REMOVAL (N/A ) INTRAUTERINE DEVICE (IUD) INSERTION (N/A )  Patient Location: PACU  Anesthesia Type:General  Level of Consciousness: drowsy and responds to stimulation  Airway & Oxygen Therapy: Patient Spontanous Breathing and Patient connected to face mask oxygen  Post-op Assessment: Report given to RN and Post -op Vital signs reviewed and stable  Post vital signs: Reviewed and stable  Last Vitals:  Vitals Value Taken Time  BP 139/89 10/10/2018  1:34 PM  Temp    Pulse 100 10/10/2018  1:34 PM  Resp 17 10/10/2018  1:34 PM  SpO2 100 % 10/10/2018  1:34 PM    Last Pain:  Vitals:   10/10/18 1010  PainSc: 0-No pain         Complications: No apparent anesthesia complications

## 2018-10-10 NOTE — Op Note (Signed)
Procedure(s): INTRAUTERINE DEVICE (IUD) REMOVAL INTRAUTERINE DEVICE (IUD) INSERTION Procedure Note  Jillian Foster female 47 y.o. 10/10/2018  Indications: The patient is a 47 y.o. G46P1011 female with lost IUD threads, cervical stenosis, and unsuccessful office attempt at removal, desiring new IUD placement.   Pre-operative Diagnosis: lost IUD threads, cervical stenosis, and unsuccessful office attempt at removal, desiring new IUD placement.   Post-operative Diagnosis: Same  Surgeon: Rubie Maid, MD  Assistants:  None   Anesthesia: General endotracheal anesthesia  Findings: Cervical stenosis present.  The uterus was sounded to 8 cm.   Procedure Details: The patient was seen in the Holding Room. The risks, benefits, complications, treatment options, and expected outcomes were discussed with the patient.  The patient concurred with the proposed plan, giving informed consent.  The site of surgery properly noted/marked. The patient was taken to the Operating Room, identified as Jillian Foster and the procedure verified as Procedure(s) (LRB): INTRAUTERINE DEVICE (IUD) REMOVAL (N/A), INTRAUTERINE DEVICE (IUD) INSERTION (N/A). A Time Out was held and the above information confirmed.  She was then placed under general anesthesia without difficulty. She was placed in the dorsal lithotomy position, and was prepped and draped in a sterile manner.  A straight catheterization was performed. A sterile speculum was inserted into the vagina and the cervix was grasped at the anterior lip using a single-toothed tenaculum. The cervix was noted to be stenotic.  The cervix was dilated until an IUD hook could be passed through the cervical canal.  The IUD threads were retrieved, and the IUD was removed using a ring forceps. The uterus was then sounded to 8 cm, and the Mirena IUD was placed per manufacturer's recommendations.  Strings trimmed to 3 cm. Tenaculum was removed, good hemostasis  noted.  Patient tolerated procedure well.  All instruments, needles, and sponge counts were correct x 2. The patient was taken to the recovery room awake, extubated and in stable condition.   Patient was given post-procedure instructions.  She was advised to have backup contraception for one week.  Patient was also asked to check IUD strings periodically and follow up in 4 weeks for IUD check.  Estimated Blood Loss:  minimal      Drains: straight catheterization prior to procedure with  250 ml of clear urine         Total IV Fluids:  400 ml  Specimens: None         Implants: Mirena IUD.  Lot# [id number].  Exp: [id number]         Complications:  None; patient tolerated the procedure well.         Disposition: PACU - hemodynamically stable.         Condition: stable    Rubie Maid, MD [facility]

## 2018-10-10 NOTE — Anesthesia Preprocedure Evaluation (Signed)
Anesthesia Evaluation  Patient identified by MRN, date of birth, ID band Patient awake    Reviewed: Allergy & Precautions, H&P , NPO status , Patient's Chart, lab work & pertinent test results, reviewed documented beta blocker date and time   Airway Mallampati: II  TM Distance: >3 FB Neck ROM: full    Dental  (+) Teeth Intact   Pulmonary neg pulmonary ROS,    Pulmonary exam normal        Cardiovascular Exercise Tolerance: Good negative cardio ROS Normal cardiovascular exam Rate:Normal     Neuro/Psych Anxiety negative neurological ROS  negative psych ROS   GI/Hepatic negative GI ROS, Neg liver ROS,   Endo/Other  negative endocrine ROSdiabetes  Renal/GU negative Renal ROS  negative genitourinary   Musculoskeletal   Abdominal   Peds  Hematology negative hematology ROS (+) Blood dyscrasia, anemia ,   Anesthesia Other Findings   Reproductive/Obstetrics negative OB ROS                             Anesthesia Physical Anesthesia Plan  ASA: II  Anesthesia Plan: General LMA   Post-op Pain Management:    Induction:   PONV Risk Score and Plan:   Airway Management Planned:   Additional Equipment:   Intra-op Plan:   Post-operative Plan:   Informed Consent: I have reviewed the patients History and Physical, chart, labs and discussed the procedure including the risks, benefits and alternatives for the proposed anesthesia with the patient or authorized representative who has indicated his/her understanding and acceptance.     Plan Discussed with: CRNA  Anesthesia Plan Comments:         Anesthesia Quick Evaluation

## 2018-10-13 ENCOUNTER — Encounter: Payer: Self-pay | Admitting: Obstetrics and Gynecology

## 2018-10-17 ENCOUNTER — Ambulatory Visit (INDEPENDENT_AMBULATORY_CARE_PROVIDER_SITE_OTHER): Payer: Commercial Managed Care - PPO | Admitting: Obstetrics and Gynecology

## 2018-10-17 ENCOUNTER — Encounter: Payer: Self-pay | Admitting: Obstetrics and Gynecology

## 2018-10-17 VITALS — BP 120/80 | HR 116 | Ht 67.0 in | Wt 181.2 lb

## 2018-10-17 DIAGNOSIS — E119 Type 2 diabetes mellitus without complications: Secondary | ICD-10-CM

## 2018-10-17 DIAGNOSIS — N898 Other specified noninflammatory disorders of vagina: Secondary | ICD-10-CM

## 2018-10-17 DIAGNOSIS — Z01419 Encounter for gynecological examination (general) (routine) without abnormal findings: Secondary | ICD-10-CM | POA: Diagnosis not present

## 2018-10-17 DIAGNOSIS — E049 Nontoxic goiter, unspecified: Secondary | ICD-10-CM

## 2018-10-17 DIAGNOSIS — E663 Overweight: Secondary | ICD-10-CM

## 2018-10-17 NOTE — Progress Notes (Signed)
Subjective:   Jillian Foster is a 48 y.o. G64P1011 African American female here for a routine well-woman exam.  No LMP recorded. (Menstrual status: IUD).    Current complaints: none PCP: me       does desire labs  Social History: Sexual: heterosexual Marital Status: married Living situation: with family Occupation: unknown occupation Tobacco/alcohol: no tobacco use Illicit drugs: no history of illicit drug use  The following portions of the patient's history were reviewed and updated as appropriate: allergies, current medications, past family history, past medical history, past social history, past surgical history and problem list.  Past Medical History Past Medical History:  Diagnosis Date  . 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 Anxiety   . Diabetes mellitus without complication ([place])   . Genital herpes   . Goiter   . High risk human papilloma cervical smear 2015  . History of abnormal cervical Pap smear    POsitive HRHPV  . Insomnia     Past Surgical History Past Surgical History:  Procedure Laterality Date  . BIOPSY THYROID  01/18/2014   B9 FNA  . BREAST BIOPSY  08/2010   Left benign   Fibroadenoma  . CESAREAN SECTION  05/2000   Failed ECV..Breech  . CHOLECYSTECTOMY    . COLPOSCOPY    . COMBINED HYSTEROSCOPY DIAGNOSTIC / D&C    . INTRAUTERINE DEVICE (IUD) INSERTION N/A 10/10/2018   Procedure: INTRAUTERINE DEVICE (IUD) INSERTION;  Surgeon: Rubie Maid, MD;  Location: [facility] ORS;  Service: Gynecology;  Laterality: N/A;  . INTRAUTERINE DEVICE INSERTION  08/25/2013  . IUD REMOVAL N/A 10/10/2018   Procedure: INTRAUTERINE DEVICE (IUD) REMOVAL;  Surgeon: Rubie Maid, MD;  Location: [facility] ORS;  Service: Gynecology;  Laterality: N/A;  CERVICAL DILATION    Gynecologic History G2P1011  No LMP recorded. (Menstrual status: IUD). Contraception: IUD Last Pap: 08/2016. Results were: normal Last mammogram: 03/2018. Results were: normal   Obstetric History OB  History  Gravida Para Term Preterm AB Living  2 1 1   1 1   SAB TAB Ectopic Multiple Live Births          1    # Outcome Date GA Lbr Len/2nd Weight Sex Delivery Anes PTL Lv  2 Term 05/16/00   8 lb 2 oz (3.685 kg) F CS-LTranv  N LIV     Complications: Breech birth  1 AB             Current Medications Current Outpatient Medications on File Prior to Visit  Medication Sig Dispense Refill  . cyanocobalamin (,VITAMIN B-12,) 1000 MCG/ML injection Inject 1 mL (1,000 mcg total) into the muscle every 30 (thirty) days. 1 mL 3  . Dulaglutide (TRULICITY) 5.57 DU/2.0UR SOPN Inject 0.75 mg into the skin every Tuesday.     . empagliflozin (JARDIANCE) 25 MG TABS tablet Take 25 mg by mouth daily.    Marland Kitchen glipiZIDE (GLUCOTROL XL) 2.5 MG 24 hr tablet Take 2.5 mg by mouth daily with breakfast.    . glucose blood ([employer] TEST STRIPS) test strip Use once daily.    Marland Kitchen ibuprofen (ADVIL,MOTRIN) 800 MG tablet Take 1 tablet (800 mg total) by mouth every 8 (eight) hours as needed. 60 tablet 1  . levonorgestrel (MIRENA) 20 MCG/24HR IUD by Intrauterine route.    . phentermine (ADIPEX-P) 37.5 MG tablet Take 1 tablet (37.5 mg total) by mouth daily before breakfast. 30 tablet 2  . pravastatin (PRAVACHOL) 20 MG tablet Take 20 mg by mouth daily.    Marland Kitchen  Blood Glucose Monitoring Suppl (GLUCOCOM BLOOD GLUCOSE MONITOR) DEVI One Touch Verio meter. Use to check sugar once daily.    . Ibuprofen-diphenhydrAMINE HCl (ADVIL PM) 200-25 MG CAPS Take 1 tablet by mouth at bedtime as needed (sleep/pain.).     No current facility-administered medications on file prior to visit.     Review of Systems Patient denies any headaches, blurred vision, shortness of breath, chest pain, abdominal pain, problems with bowel movements, urination, or intercourse.  Objective:  BP 120/80   Pulse (!) 116   Ht 5' 7"  (1.702 m)   Wt 181 lb 3.2 oz (82.2 kg)   BMI 28.38 kg/m  Physical Exam  General:  Well developed, well nourished, no acute distress. She  is alert and oriented x3. Skin:  Warm and dry Neck:  Midline trachea, thyroid enlarged(not new) or nodules Cardiovascular: Regular rate and rhythm, no murmur heard Lungs:  Effort normal, all lung fields clear to auscultation bilaterally Breasts:  No dominant palpable mass, retraction, or nipple discharge Abdomen:  Soft, non tender, no hepatosplenomegaly or masses Pelvic:  External genitalia is normal in appearance.  The vagina is normal in appearance. The cervix is bulbous, no CMT. Thick white d/c noted at cervix c/w yeast. Thin prep pap is not done.IUD string noted Uterus is felt to be normal size, shape, and contour.  No adnexal masses or tenderness noted. Extremities:  No swelling or varicosities noted Psych:  She has a normal mood and affect  Assessment:   Healthy well-woman exam IUD check BMI 28  Plan:  nuswab obtained- will follow up accordingly, states has been using metrogel weekly due to re-occurring odor per PA at [facility]. F/U 1 year for AE, or sooner if needed   Melody Rockney Ghee, CNM

## 2018-10-17 NOTE — Anesthesia Postprocedure Evaluation (Signed)
Anesthesia Post Note  Patient: Jillian Foster  Procedure(s) Performed: INTRAUTERINE DEVICE (IUD) REMOVAL (N/A ) INTRAUTERINE DEVICE (IUD) INSERTION (N/A )  Patient location during evaluation: PACU Anesthesia Type: General Level of consciousness: awake and alert Pain management: pain level controlled Vital Signs Assessment: post-procedure vital signs reviewed and stable Respiratory status: spontaneous breathing, nonlabored ventilation, respiratory function stable and patient connected to nasal cannula oxygen Cardiovascular status: blood pressure returned to baseline and stable Postop Assessment: no apparent nausea or vomiting Anesthetic complications: no     Last Vitals:  Vitals:   10/10/18 1403 10/10/18 1440  BP: (!) 119/101 120/90  Pulse: 90 90  Resp: 18 18  Temp: (!) 36.2 C   SpO2: 100% 100%    Last Pain:  Vitals:   10/10/18 1403  TempSrc: Temporal  PainSc: 0-No pain                 Molli Barrows

## 2018-10-18 LAB — COMPREHENSIVE METABOLIC PANEL
ALT: 13 IU/L (ref 0–32)
AST: 14 IU/L (ref 0–40)
Albumin/Globulin Ratio: 1.4 (ref 1.2–2.2)
Albumin: 4.3 g/dL (ref 3.5–5.5)
Alkaline Phosphatase: 93 IU/L (ref 39–117)
BUN/Creatinine Ratio: 14 (ref 9–23)
BUN: 12 mg/dL (ref 6–24)
Bilirubin Total: 0.4 mg/dL (ref 0.0–1.2)
CO2: 24 mmol/L (ref 20–29)
Calcium: 10 mg/dL (ref 8.7–10.2)
Chloride: 101 mmol/L (ref 96–106)
Creatinine, Ser: 0.85 mg/dL (ref 0.57–1.00)
GFR calc Af Amer: 95 mL/min/{1.73_m2} (ref >59)
GFR calc non Af Amer: 82 mL/min/{1.73_m2} (ref >59)
Globulin, Total: 3.1 g/dL (ref 1.5–4.5)
Glucose: 247 mg/dL — ABNORMAL HIGH (ref 65–99)
Potassium: 4.8 mmol/L (ref 3.5–5.2)
Sodium: 140 mmol/L (ref 134–144)
TOTAL PROTEIN: 7.4 g/dL (ref 6.0–8.5)

## 2018-10-18 LAB — VITAMIN D 25 HYDROXY (VIT D DEFICIENCY, FRACTURES): Vit D, 25-Hydroxy: 12.2 ng/mL — ABNORMAL LOW (ref 30.0–100.0)

## 2018-10-18 LAB — CBC
Hematocrit: 42.8 % (ref 34.0–46.6)
Hemoglobin: 13.9 g/dL (ref 11.1–15.9)
MCH: 27.4 pg (ref 26.6–33.0)
MCHC: 32.5 g/dL (ref 31.5–35.7)
MCV: 84 fL (ref 79–97)
Platelets: 331 10*3/uL (ref 150–450)
RBC: 5.08 x10E6/uL (ref 3.77–5.28)
RDW: 12.7 % (ref 12.3–15.4)
WBC: 6 10*3/uL (ref 3.4–10.8)

## 2018-10-18 LAB — LIPID PANEL
Chol/HDL Ratio: 3.2 ratio (ref 0.0–4.4)
Cholesterol, Total: 142 mg/dL (ref 100–199)
HDL: 44 mg/dL (ref >39)
LDL Calculated: 81 mg/dL (ref 0–99)
TRIGLYCERIDES: 86 mg/dL (ref 0–149)
VLDL Cholesterol Cal: 17 mg/dL (ref 5–40)

## 2018-10-18 LAB — HEMOGLOBIN A1C
ESTIMATED AVERAGE GLUCOSE: 203 mg/dL
Hgb A1c MFr Bld: 8.7 % — ABNORMAL HIGH (ref 4.8–5.6)

## 2018-10-18 LAB — THYROID PANEL WITH TSH
Free Thyroxine Index: 1.9 (ref 1.2–4.9)
T3 Uptake Ratio: 25 % (ref 24–39)
T4 TOTAL: 7.6 ug/dL (ref 4.5–12.0)
TSH: 0.79 u[IU]/mL (ref 0.450–4.500)

## 2018-10-19 LAB — NUSWAB VAGINITIS PLUS (VG+)
Candida albicans, NAA: NEGATIVE
Candida glabrata, NAA: NEGATIVE
Chlamydia trachomatis, NAA: NEGATIVE
Megasphaera 1: HIGH Score — AB
Neisseria gonorrhoeae, NAA: NEGATIVE
TRICH VAG BY NAA: NEGATIVE

## 2018-10-21 ENCOUNTER — Other Ambulatory Visit: Payer: Self-pay | Admitting: Obstetrics and Gynecology

## 2018-10-21 ENCOUNTER — Telehealth: Payer: Self-pay | Admitting: *Deleted

## 2018-10-21 DIAGNOSIS — E559 Vitamin D deficiency, unspecified: Secondary | ICD-10-CM

## 2018-10-21 MED ORDER — CLINDAMYCIN PHOSPHATE (1 DOSE) 2 % VA CREA: 1.0000 | TOPICAL_CREAM | Freq: Two times a day (BID) | VAGINAL | 1 refills | Status: DC

## 2018-10-21 MED ORDER — VITAMIN D (ERGOCALCIFEROL) 1.25 MG (50000 UNIT) PO CAPS: 50000.0000 [IU] | ORAL_CAPSULE | ORAL | 1 refills | Status: DC

## 2018-10-21 NOTE — Telephone Encounter (Signed)
-----   Message from Joylene Igo, [place] sent at 10/21/2018 11:16 AM EST ----- Please mail info on vit D

## 2018-10-21 NOTE — Telephone Encounter (Signed)
Notified pt of results

## 2018-11-04 ENCOUNTER — Other Ambulatory Visit: Payer: Self-pay | Admitting: Obstetrics and Gynecology

## 2018-11-06 ENCOUNTER — Other Ambulatory Visit: Payer: Self-pay | Admitting: *Deleted

## 2018-11-06 MED ORDER — PHENTERMINE HCL 37.5 MG PO TABS: 37.5000 mg | ORAL_TABLET | Freq: Every day | ORAL | 2 refills | Status: DC

## 2018-11-07 ENCOUNTER — Ambulatory Visit (INDEPENDENT_AMBULATORY_CARE_PROVIDER_SITE_OTHER): Payer: Commercial Managed Care - PPO | Admitting: Obstetrics and Gynecology

## 2018-11-07 ENCOUNTER — Encounter: Payer: Self-pay | Admitting: Obstetrics and Gynecology

## 2018-11-07 VITALS — BP 147/81 | HR 112 | Ht 67.0 in | Wt 182.7 lb

## 2018-11-07 DIAGNOSIS — Z9889 Other specified postprocedural states: Secondary | ICD-10-CM | POA: Diagnosis not present

## 2018-11-07 DIAGNOSIS — Z30431 Encounter for routine checking of intrauterine contraceptive device: Secondary | ICD-10-CM | POA: Diagnosis not present

## 2018-11-07 DIAGNOSIS — Z09 Encounter for follow-up examination after completed treatment for conditions other than malignant neoplasm: Secondary | ICD-10-CM

## 2018-11-07 NOTE — Progress Notes (Signed)
    OBSTETRICS/GYNECOLOGY POST-OPERATIVE CLINIC VISIT  Subjective:     Jillian Foster is a 48 y.o. G57P1011 female who presents to the clinic 3 weeks status post Hysteroscopic IUD removal with new IUD insertion for lost IUD threads. Eating a regular diet without difficulty. Bowel movements are normal. The patient is not having any pain.  She has no concerns regarding the IUD. Denies any side effects.   The following portions of the patient's history were reviewed and updated as appropriate: allergies, current medications, past family history, past medical history, past social history, past surgical history and problem list.  Review of Systems Pertinent items noted in HPI and remainder of comprehensive ROS otherwise negative.    Objective:    BP (!) 147/81   Pulse (!) 112   Ht 5' 7"  (1.702 m)   Wt 182 lb 11.2 oz (82.9 kg)   BMI 28.61 kg/m  General:  alert and no distress  Abdomen: soft, bowel sounds active, non-tender  Pelvis: :   Normal appearing external genitalia; normal appearing vaginal mucosa and cervix.  IUD strings visualized, about 3 cm in length outside cervix. Moderate amount of thick white vaginal discharge, no odor.   Extremities:  extremities normal, atraumatic, no cyanosis or edema  Neurologic:   Grossly normal      Assessment & Plan:  - Doing well post-operatively - Patient to keep IUD in place for up to seven years; can come in for removal if she desires pregnancy earlier or for or concerning side effects. - No activity restrictions.    Rubie Maid, MD [facility]

## 2018-11-07 NOTE — Progress Notes (Signed)
Pt is present today for a follow up after having an IUD removed and reinserted at the hospital. Pt stated that she is doing well no complaints.

## 2018-11-08 ENCOUNTER — Encounter: Payer: Self-pay | Admitting: Obstetrics and Gynecology

## 2018-11-14 ENCOUNTER — Encounter: Payer: Commercial Managed Care - PPO | Admitting: Obstetrics and Gynecology

## 2018-11-25 ENCOUNTER — Encounter: Payer: Commercial Managed Care - PPO | Admitting: Obstetrics and Gynecology

## 2018-11-27 ENCOUNTER — Encounter: Payer: Self-pay | Admitting: Obstetrics and Gynecology

## 2018-11-27 ENCOUNTER — Encounter: Payer: Commercial Managed Care - PPO | Admitting: Obstetrics and Gynecology

## 2018-11-27 ENCOUNTER — Ambulatory Visit (INDEPENDENT_AMBULATORY_CARE_PROVIDER_SITE_OTHER): Payer: Commercial Managed Care - PPO | Admitting: Obstetrics and Gynecology

## 2018-11-27 VITALS — BP 123/79 | HR 98 | Ht 67.0 in | Wt 181.0 lb

## 2018-11-27 DIAGNOSIS — E663 Overweight: Secondary | ICD-10-CM

## 2018-11-27 MED ORDER — CYANOCOBALAMIN 1000 MCG/ML IJ SOLN
1000.0000 ug | Freq: Once | INTRAMUSCULAR | Status: AC
  Administered 2018-11-27: 1000 ug via INTRAMUSCULAR

## 2018-11-27 NOTE — Progress Notes (Signed)
Pt is here for wt, bp check, b-12 inj She is doing well, denies any s/e  11/27/18 wt- 181lb 10/17/18 wt- 181  Waist 31 in

## 2018-12-25 ENCOUNTER — Other Ambulatory Visit: Payer: Self-pay

## 2018-12-25 ENCOUNTER — Encounter: Payer: Self-pay | Admitting: Obstetrics and Gynecology

## 2018-12-25 ENCOUNTER — Ambulatory Visit (INDEPENDENT_AMBULATORY_CARE_PROVIDER_SITE_OTHER): Payer: Commercial Managed Care - PPO | Admitting: Obstetrics and Gynecology

## 2018-12-25 VITALS — BP 127/84 | HR 114 | Ht 67.0 in | Wt 181.0 lb

## 2018-12-25 DIAGNOSIS — E663 Overweight: Secondary | ICD-10-CM | POA: Diagnosis not present

## 2018-12-25 MED ORDER — CYANOCOBALAMIN 1000 MCG/ML IJ SOLN
1000.0000 ug | Freq: Once | INTRAMUSCULAR | Status: AC
  Administered 2018-12-25: 1000 ug via INTRAMUSCULAR

## 2018-12-25 NOTE — Progress Notes (Signed)
Pt is here for wt, bp cehck,b-12 inj She is doing well, denies any s/e  12/25/18 wt- 181lb 11/27/18 wt- 181lb  Waist 30 in

## 2019-01-22 ENCOUNTER — Encounter: Payer: Commercial Managed Care - PPO | Admitting: Obstetrics and Gynecology

## 2019-02-11 ENCOUNTER — Other Ambulatory Visit: Payer: Self-pay | Admitting: Obstetrics and Gynecology

## 2019-03-18 ENCOUNTER — Ambulatory Visit (INDEPENDENT_AMBULATORY_CARE_PROVIDER_SITE_OTHER): Payer: Commercial Managed Care - PPO | Admitting: Obstetrics and Gynecology

## 2019-03-18 ENCOUNTER — Other Ambulatory Visit: Payer: Self-pay

## 2019-03-18 ENCOUNTER — Encounter: Payer: Self-pay | Admitting: Obstetrics and Gynecology

## 2019-03-18 VITALS — BP 122/84 | HR 107 | Ht 69.0 in | Wt 176.4 lb

## 2019-03-18 DIAGNOSIS — E119 Type 2 diabetes mellitus without complications: Secondary | ICD-10-CM | POA: Diagnosis not present

## 2019-03-18 DIAGNOSIS — E559 Vitamin D deficiency, unspecified: Secondary | ICD-10-CM | POA: Diagnosis not present

## 2019-03-18 DIAGNOSIS — Z6826 Body mass index (BMI) 26.0-26.9, adult: Secondary | ICD-10-CM | POA: Diagnosis not present

## 2019-03-18 DIAGNOSIS — Z7689 Persons encountering health services in other specified circumstances: Secondary | ICD-10-CM

## 2019-03-18 DIAGNOSIS — E663 Overweight: Secondary | ICD-10-CM | POA: Diagnosis not present

## 2019-03-18 NOTE — Patient Instructions (Signed)
Vitamin D Deficiency Vitamin D deficiency is when your body does not have enough vitamin D. Vitamin D is important to your body for many reasons:  It helps the body to absorb two important minerals, called calcium and phosphorus.  It plays a role in bone health.  It may help to prevent some diseases, such as diabetes and multiple sclerosis.  It plays a role in muscle function, including heart function. You can get vitamin D by:  Eating foods that naturally contain vitamin D.  Eating or drinking milk or other dairy products that have vitamin D added to them.  Taking a vitamin D supplement or a multivitamin supplement that contains vitamin D.  Being in the sun. Your body naturally makes vitamin D when your skin is exposed to sunlight. Your body changes the sunlight into a form of the vitamin that the body can use. If vitamin D deficiency is severe, it can cause a condition in which your bones become soft. In adults, this condition is called osteomalacia. In children, this condition is called rickets. What are the causes? Vitamin D deficiency may be caused by:  Not eating enough foods that contain vitamin D.  Not getting enough sun exposure.  Having certain digestive system diseases that make it difficult for your body to absorb vitamin D. These diseases include Crohn disease, chronic pancreatitis, and cystic fibrosis.  Having a surgery in which a part of the stomach or a part of the small intestine is removed.  Being obese.  Having chronic kidney disease or liver disease. What increases the risk? This condition is more likely to develop in:  Older people.  People who do not spend much time outdoors.  People who live in a long-term care facility.  People who have had broken bones.  People with weak or thin bones (osteoporosis).  People who have a disease or condition that changes how the body absorbs vitamin D.  People who have dark skin.  People who take certain  medicines, such as steroid medicines or certain seizure medicines.  People who are overweight or obese. What are the signs or symptoms? In mild cases of vitamin D deficiency, there may not be any symptoms. If the condition is severe, symptoms may include:  Bone pain.  Muscle pain.  Falling often.  Broken bones caused by a minor injury. How is this diagnosed? This condition is usually diagnosed with a blood test. How is this treated? Treatment for this condition may depend on what caused the condition. Treatment options include:  Taking vitamin D supplements.  Taking a calcium supplement. Your health care provider will suggest what dose is best for you. Follow these instructions at home:  Take medicines and supplements only as told by your health care provider.  Eat foods that contain vitamin D. Choices include: ? Fortified dairy products, cereals, or juices. Fortified means that vitamin D has been added to the food. Check the label on the package to be sure. ? Fatty fish, such as salmon or trout. ? Eggs. ? Oysters.  Do not use a tanning bed.  Maintain a healthy weight. Lose weight, if needed.  Keep all follow-up visits as told by your health care provider. This is important. Contact a health care provider if:  Your symptoms do not go away.  You feel like throwing up (nausea) or you throw up (vomit).  You have fewer bowel movements than usual or it is difficult for you to have a bowel movement (constipation). This information is  not intended to replace advice given to you by your health care provider. Make sure you discuss any questions you have with your health care provider. Document Released: 12/24/2011 Document Revised: 03/14/2016 Document Reviewed: 02/16/2015 Elsevier Interactive Patient Education  2019 [employer].

## 2019-03-18 NOTE — Progress Notes (Signed)
SUBJECTIVE:  48 y.o. here for follow-up weight loss visit, previously seen 9 weeks ago. Denies any concerns and feels like medication is working well, is still exercising daily. Has been out adipex for this week.  Also needs labs repeated for vit d and DM.  OBJECTIVE:  BP 122/84   Pulse (!) 107   Ht 5' 9"  (1.753 m)   Wt 176 lb 6.4 oz (80 kg)   BMI 26.05 kg/m   Body mass index is 26.05 kg/m. Patient appears well. ASSESSMENT:  Overweight- responding well to weight loss plan DM Vit D deficiency  PLAN:  To continue with current medications. B12 1020mg/ml injection given Labs obtained for follow up  RTC in 4 weeks as planned  Melody SLaCoste CNM

## 2019-03-19 LAB — COMPREHENSIVE METABOLIC PANEL
ALT: 16 IU/L (ref 0–32)
AST: 14 IU/L (ref 0–40)
Albumin/Globulin Ratio: 1.4 (ref 1.2–2.2)
Albumin: 4.2 g/dL (ref 3.8–4.8)
Alkaline Phosphatase: 94 IU/L (ref 39–117)
BUN/Creatinine Ratio: 18 (ref 9–23)
BUN: 14 mg/dL (ref 6–24)
Bilirubin Total: 0.4 mg/dL (ref 0.0–1.2)
CO2: 26 mmol/L (ref 20–29)
Calcium: 9.6 mg/dL (ref 8.7–10.2)
Chloride: 101 mmol/L (ref 96–106)
Creatinine, Ser: 0.78 mg/dL (ref 0.57–1.00)
GFR calc Af Amer: 105 mL/min/{1.73_m2} (ref >59)
GFR calc non Af Amer: 91 mL/min/{1.73_m2} (ref >59)
Globulin, Total: 3 g/dL (ref 1.5–4.5)
Glucose: 262 mg/dL — ABNORMAL HIGH (ref 65–99)
Potassium: 4.9 mmol/L (ref 3.5–5.2)
Sodium: 140 mmol/L (ref 134–144)
Total Protein: 7.2 g/dL (ref 6.0–8.5)

## 2019-03-19 LAB — VITAMIN D 25 HYDROXY (VIT D DEFICIENCY, FRACTURES): Vit D, 25-Hydroxy: 32.9 ng/mL (ref 30.0–100.0)

## 2019-03-19 LAB — HEMOGLOBIN A1C
Est. average glucose Bld gHb Est-mCnc: 171 mg/dL
Hgb A1c MFr Bld: 7.6 % — ABNORMAL HIGH (ref 4.8–5.6)

## 2019-04-14 ENCOUNTER — Telehealth: Payer: Self-pay

## 2019-04-14 NOTE — Telephone Encounter (Signed)
Coronavirus (COVID-19) Are you at risk?  Are you at risk for the Coronavirus (COVID-19)?  To be considered HIGH RISK for Coronavirus (COVID-19), you have to meet the following criteria:  . Traveled to [place], [place], [place], [place] or [place]; or in the [place] to [place], [place], [place], or [place]; and have fever, cough, and shortness of breath within the last 2 weeks of travel OR . Been in close contact with a person diagnosed with COVID-19 within the last 2 weeks and have fever, cough, and shortness of breath . IF YOU DO NOT MEET THESE CRITERIA, YOU ARE CONSIDERED LOW RISK FOR COVID-19.  What to do if you are HIGH RISK for COVID-19?  . If you are having a medical emergency, call 911. . Seek medical care right away. Before you go to a doctor's office, urgent care or emergency department, call ahead and tell them about your recent travel, contact with someone diagnosed with COVID-19, and your symptoms. You should receive instructions from your physician's office regarding next steps of care.  . When you arrive at healthcare provider, tell the healthcare staff immediately you have returned from visiting [place], [place], [place], [place] or [place]; or traveled in the [place] to [place], [place], [place], or [place]; in the last two weeks or you have been in close contact with a person diagnosed with COVID-19 in the last 2 weeks.   . Tell the health care staff about your symptoms: fever, cough and shortness of breath. . After you have been seen by a medical provider, you will be either: o Tested for (COVID-19) and discharged home on quarantine except to seek medical care if symptoms worsen, and asked to  - Stay home and avoid contact with others until you get your results (4-5 days)  - Avoid travel on public transportation if possible (such as bus, train, or airplane) or o Sent to the Emergency Department by EMS for evaluation, COVID-19 testing, and possible  admission depending on your condition and test results.  What to do if you are LOW RISK for COVID-19?  Reduce your risk of any infection by using the same precautions used for avoiding the common cold or flu:  . Wash your hands often with soap and warm water for at least 20 seconds.  If soap and water are not readily available, use an alcohol-based hand sanitizer with at least 60% alcohol.  . If coughing or sneezing, cover your mouth and nose by coughing or sneezing into the elbow areas of your shirt or coat, into a tissue or into your sleeve (not your hands). . Avoid shaking hands with others and consider head nods or verbal greetings only. . Avoid touching your eyes, nose, or mouth with unwashed hands.  . Avoid close contact with people who are sick. . Avoid places or events with large numbers of people in one location, like concerts or sporting events. . Carefully consider travel plans you have or are making. . If you are planning any travel outside or inside the [place], visit the CDC's Travelers' Health webpage for the latest health notices. . If you have some symptoms but not all symptoms, continue to monitor at home and seek medical attention if your symptoms worsen. . If you are having a medical emergency, call 911.   ADDITIONAL HEALTHCARE OPTIONS FOR PATIENTS  [facility] / e-Visit: [email or web address]         [facility]: [phone]  [place]   Urgent Care: [facility]: [phone]   Pre-screen negative, DM.

## 2019-04-15 ENCOUNTER — Other Ambulatory Visit: Payer: Self-pay

## 2019-04-15 ENCOUNTER — Ambulatory Visit (INDEPENDENT_AMBULATORY_CARE_PROVIDER_SITE_OTHER): Payer: Commercial Managed Care - PPO | Admitting: Obstetrics and Gynecology

## 2019-04-15 ENCOUNTER — Encounter: Payer: Self-pay | Admitting: Obstetrics and Gynecology

## 2019-04-15 VITALS — BP 125/88 | HR 110 | Ht 69.0 in | Wt 175.0 lb

## 2019-04-15 DIAGNOSIS — Z6826 Body mass index (BMI) 26.0-26.9, adult: Secondary | ICD-10-CM

## 2019-04-15 MED ORDER — CYANOCOBALAMIN 1000 MCG/ML IJ SOLN
1000.0000 ug | Freq: Once | INTRAMUSCULAR | Status: AC
  Administered 2019-04-15: 1000 ug via INTRAMUSCULAR

## 2019-04-15 NOTE — Progress Notes (Signed)
Pt is here for wt, bp check, b-12 inj She is doing well, denies any s/e  04/15/19 wt- 175lb 03/18/19 wt- 176lb  Waist 31.5 in

## 2019-05-19 ENCOUNTER — Encounter: Payer: Self-pay | Admitting: Obstetrics and Gynecology

## 2019-05-19 ENCOUNTER — Other Ambulatory Visit: Payer: Self-pay

## 2019-05-19 ENCOUNTER — Ambulatory Visit (INDEPENDENT_AMBULATORY_CARE_PROVIDER_SITE_OTHER): Payer: Commercial Managed Care - PPO | Admitting: Obstetrics and Gynecology

## 2019-05-19 VITALS — BP 125/87 | HR 98 | Ht 67.0 in | Wt 171.2 lb

## 2019-05-19 DIAGNOSIS — E559 Vitamin D deficiency, unspecified: Secondary | ICD-10-CM

## 2019-05-19 DIAGNOSIS — Z7689 Persons encountering health services in other specified circumstances: Secondary | ICD-10-CM | POA: Diagnosis not present

## 2019-05-19 DIAGNOSIS — Z6826 Body mass index (BMI) 26.0-26.9, adult: Secondary | ICD-10-CM | POA: Diagnosis not present

## 2019-05-19 DIAGNOSIS — E119 Type 2 diabetes mellitus without complications: Secondary | ICD-10-CM | POA: Diagnosis not present

## 2019-05-19 MED ORDER — CYANOCOBALAMIN 1000 MCG/ML IJ SOLN: 1000.0000 ug | Freq: Once | INTRAMUSCULAR | Status: DC

## 2019-05-19 NOTE — Progress Notes (Signed)
Pt is here for wt, bp check, b-12 inj She is doing well, has been taking 1/2 tablet for some time and it is still working. Is walking 2-3 miles a day. Desires continuing meds until BMI is <25. Needs labs repeated next month.  05/19/19 wt- 171.2lb 04/15/19 wt- 175lb 03/18/19 wt- 176lb   Congratulated on continued weight loss. Will continue at this time. RTC 1 month for labs/wt/BP/B12.    Melody Shambley,CNM

## 2019-06-16 ENCOUNTER — Other Ambulatory Visit: Payer: Self-pay | Admitting: Obstetrics and Gynecology

## 2019-06-19 ENCOUNTER — Other Ambulatory Visit: Payer: Self-pay

## 2019-06-19 ENCOUNTER — Encounter: Payer: Self-pay | Admitting: Obstetrics and Gynecology

## 2019-06-19 ENCOUNTER — Ambulatory Visit (INDEPENDENT_AMBULATORY_CARE_PROVIDER_SITE_OTHER): Payer: Commercial Managed Care - PPO | Admitting: Obstetrics and Gynecology

## 2019-06-19 VITALS — BP 124/84 | HR 88 | Ht 67.0 in | Wt 167.2 lb

## 2019-06-19 DIAGNOSIS — Z6826 Body mass index (BMI) 26.0-26.9, adult: Secondary | ICD-10-CM

## 2019-06-19 MED ORDER — CYANOCOBALAMIN 1000 MCG/ML IJ SOLN
1000.0000 ug | Freq: Once | INTRAMUSCULAR | Status: AC
  Administered 2019-06-19: 1000 ug via INTRAMUSCULAR

## 2019-06-19 NOTE — Progress Notes (Signed)
Pt is here for wt, bp check, b-12 inj She is doing good, feels like she is at her goal weight Pt does desire refill on phentermine to have on hand  06/19/19 wt- 167.2lb 05/19/19 wt- 171.2lb   Waist 30in

## 2019-08-20 ENCOUNTER — Other Ambulatory Visit: Payer: Self-pay

## 2019-08-20 DIAGNOSIS — Z20822 Contact with and (suspected) exposure to covid-19: Secondary | ICD-10-CM

## 2019-08-21 LAB — NOVEL CORONAVIRUS, NAA: SARS-CoV-2, NAA: NOT DETECTED

## 2019-09-18 ENCOUNTER — Other Ambulatory Visit: Payer: Self-pay

## 2019-09-18 DIAGNOSIS — Z20822 Contact with and (suspected) exposure to covid-19: Secondary | ICD-10-CM

## 2019-09-21 LAB — NOVEL CORONAVIRUS, NAA: SARS-CoV-2, NAA: NOT DETECTED

## 2019-12-17 ENCOUNTER — Other Ambulatory Visit: Payer: Self-pay

## 2019-12-17 ENCOUNTER — Ambulatory Visit (INDEPENDENT_AMBULATORY_CARE_PROVIDER_SITE_OTHER): Payer: Commercial Managed Care - PPO | Admitting: Obstetrics and Gynecology

## 2019-12-17 ENCOUNTER — Encounter: Payer: Self-pay | Admitting: Obstetrics and Gynecology

## 2019-12-17 VITALS — BP 133/84 | HR 103 | Ht 67.0 in | Wt 188.0 lb

## 2019-12-17 DIAGNOSIS — Z1231 Encounter for screening mammogram for malignant neoplasm of breast: Secondary | ICD-10-CM | POA: Diagnosis not present

## 2019-12-17 DIAGNOSIS — Z01419 Encounter for gynecological examination (general) (routine) without abnormal findings: Secondary | ICD-10-CM | POA: Diagnosis not present

## 2019-12-17 DIAGNOSIS — Z13220 Encounter for screening for lipoid disorders: Secondary | ICD-10-CM

## 2019-12-17 DIAGNOSIS — Z8639 Personal history of other endocrine, nutritional and metabolic disease: Secondary | ICD-10-CM

## 2019-12-17 DIAGNOSIS — E119 Type 2 diabetes mellitus without complications: Secondary | ICD-10-CM

## 2019-12-17 DIAGNOSIS — E663 Overweight: Secondary | ICD-10-CM

## 2019-12-17 NOTE — Progress Notes (Signed)
Pt present for annual exam. Pt currently has an IUD for birth control. Pt completed flu vaccine in 06/2019. Pt stated that she was having some symptoms of menopause like hot flashes and night sweats.

## 2019-12-17 NOTE — Progress Notes (Signed)
GYNECOLOGY ANNUAL PHYSICAL EXAM PROGRESS NOTE  Subjective:    Jillian Foster is a 49 y.o. G34P1011 female who presents for an annual exam. The patient is sexually active. The patient participates in regular exercise: yes. Has the patient ever been transfused or tattooed?: no.    The patient has the following concerns today:  1. Patient reports concerns regarding her weight.  Notes that she has been working to decrease her weight recently. Has used Phentermine in the past which helped.  2. Has general questions regarding her history of BV infections.   Gynecologic History No LMP recorded. (Menstrual status: IUD). Contraception: Mirena IUD History of STI's: Denies.  Last Pap: 06/25/2017. Results were: normal.  Has a h/o abnormal pap smear in 2015. Last mammogram: 04/08/2018. Results were: normal.     OB History  Gravida Para Term Preterm AB Living  2 1 1  0 1 1  SAB TAB Ectopic Multiple Live Births  0 0 0 0 1    # Outcome Date GA Lbr Len/2nd Weight Sex Delivery Anes PTL Lv  2 Term 05/16/00   8 lb 2 oz (3.685 kg) F CS-LTranv  N LIV     Complications: Breech birth  1 AB              Past Medical History:  Diagnosis Date  . 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 Anxiety   . Diabetes mellitus without complication ([place])   . Genital herpes   . Goiter   . High risk human papilloma cervical smear 2015  . History of abnormal cervical Pap smear    POsitive HRHPV  . Insomnia     Past Surgical History:  Procedure Laterality Date  . BIOPSY THYROID  01/18/2014   B9 FNA  . BREAST BIOPSY  08/2010   Left benign   Fibroadenoma  . CESAREAN SECTION  05/2000   Failed ECV..Breech  . CHOLECYSTECTOMY    . COLPOSCOPY    . COMBINED HYSTEROSCOPY DIAGNOSTIC / D&C    . INTRAUTERINE DEVICE (IUD) INSERTION N/A 10/10/2018   Procedure: INTRAUTERINE DEVICE (IUD) INSERTION;  Surgeon: Rubie Maid, MD;  Location: [facility];  Service: Gynecology;  Laterality: N/A;  . INTRAUTERINE DEVICE  INSERTION  08/25/2013  . IUD REMOVAL N/A 10/10/2018   Procedure: INTRAUTERINE DEVICE (IUD) REMOVAL;  Surgeon: Rubie Maid, MD;  Location: [facility];  Service: Gynecology;  Laterality: N/A;  CERVICAL DILATION    Family History  Problem Relation Age of Onset  . Diabetes Maternal Grandmother        Type 2  . Healthy Father   . Healthy Mother   . Cancer Neg Hx   . Hypertension Neg Hx   . Stroke Neg Hx   . Thyroid disease Neg Hx   . Breast cancer Neg Hx     Social History   Socioeconomic History  . Marital status: Single    Spouse name: Not on file  . Number of children: 1  . Years of education: Not on file  . Highest education level: Not on file  Occupational History  . Not on file  Tobacco Use  . Smoking status: Never Smoker  . Smokeless tobacco: Never Used  Substance and Sexual Activity  . Alcohol use: Yes  . Drug use: No  . Sexual activity: Yes    Partners: Male  Other Topics Concern  . Not on file  Social History Narrative  . Not on file   Social Determinants of Health  Financial Resource Strain:   . Difficulty of Paying Living Expenses: Not on file  Food Insecurity:   . Worried About Charity fundraiser in the Last Year: Not on file  . Ran Out of Food in the Last Year: Not on file  Transportation Needs:   . Lack of Transportation (Medical): Not on file  . Lack of Transportation (Non-Medical): Not on file  Physical Activity:   . Days of Exercise per Week: Not on file  . Minutes of Exercise per Session: Not on file  Stress:   . Feeling of Stress : Not on file  Social Connections:   . Frequency of Communication with Friends and Family: Not on file  . Frequency of Social Gatherings with Friends and Family: Not on file  . Attends Religious Services: Not on file  . Active Member of Clubs or Organizations: Not on file  . Attends Archivist Meetings: Not on file  . Marital Status: Not on file  Intimate Partner Violence:   . Fear of Current or  Ex-Partner: Not on file  . Emotionally Abused: Not on file  . Physically Abused: Not on file  . Sexually Abused: Not on file    Current Outpatient Medications on File Prior to Visit  Medication Sig Dispense Refill  . Blood Glucose Monitoring Suppl (GLUCOCOM BLOOD GLUCOSE MONITOR) DEVI One Touch Verio meter. Use to check sugar once daily.    . Clindamycin Phosphate, 1 Dose, (CLINDESSE) vaginal cream Apply 1 Applicatorful topically 2 (two) times daily. (Patient not taking: Reported on 11/27/2018) 5.8 g 1  . cyanocobalamin (,VITAMIN B-12,) 1000 MCG/ML injection Inject 1 mL (1,000 mcg total) into the muscle every 30 (thirty) days. 1 mL 3  . Dulaglutide (TRULICITY) 0.53 ZJ/6.7HA SOPN Inject 0.75 mg into the skin every Tuesday.     . empagliflozin (JARDIANCE) 25 MG TABS tablet Take 25 mg by mouth daily.    Marland Kitchen glipiZIDE (GLUCOTROL XL) 2.5 MG 24 hr tablet Take 2.5 mg by mouth daily with breakfast.    . glucose blood ([employer] TEST STRIPS) test strip Use once daily.    Marland Kitchen ibuprofen (ADVIL,MOTRIN) 800 MG tablet Take 1 tablet (800 mg total) by mouth every 8 (eight) hours as needed. 60 tablet 1  . Ibuprofen-diphenhydrAMINE HCl (ADVIL PM) 200-25 MG CAPS Take 1 tablet by mouth at bedtime as needed (sleep/pain.).    Marland Kitchen phentermine (ADIPEX-P) 37.5 MG tablet TAKE 1 TABLET BY MOUTH EVERY DAY BEFORE BREAKFAST 30 tablet 2  . pravastatin (PRAVACHOL) 20 MG tablet Take 20 mg by mouth daily.    . Vitamin D, Ergocalciferol, (DRISDOL) 1.25 MG (50000 UT) CAPS capsule Take 1 capsule (50,000 Units total) by mouth 2 (two) times a week. 30 capsule 1   Current Facility-Administered Medications on File Prior to Visit  Medication Dose Route Frequency Provider Last Rate Last Admin  . cyanocobalamin ((VITAMIN B-12)) injection 1,000 mcg  1,000 mcg Intramuscular Once Shambley, Melody N, CNM        No Known Allergies    Review of Systems Constitutional: negative for chills, fatigue, fevers and sweats. Positive for occasional hot  flushes Eyes: negative for irritation, redness and visual disturbance Ears, nose, mouth, throat, and face: negative for hearing loss, nasal congestion, snoring and tinnitus Respiratory: negative for asthma, cough, sputum Cardiovascular: negative for chest pain, dyspnea, exertional chest pressure/discomfort, irregular heart beat, palpitations and syncope Gastrointestinal: negative for abdominal pain, change in bowel habits, nausea and vomiting Genitourinary: negative for abnormal menstrual periods, genital  lesions, sexual problems and vaginal discharge, dysuria and urinary incontinence Integument/breast: negative for breast lump, breast tenderness and nipple discharge Hematologic/lymphatic: negative for bleeding and easy bruising Musculoskeletal:negative for back pain and muscle weakness Neurological: negative for dizziness, headaches, vertigo and weakness Endocrine: negative for diabetic symptoms including polydipsia, polyuria and skin dryness Allergic/Immunologic: negative for hay fever and urticaria        Objective:  Blood pressure 133/84, pulse (!) 103, height 5' 7"  (1.702 m), weight 188 lb (85.3 kg). Body mass index is 29.44 kg/m.   General Appearance:    Alert, cooperative, no distress, appears stated age, overweight  Head:    Normocephalic, without obvious abnormality, atraumatic  Eyes:    PERRL, conjunctiva/corneas clear, EOM's intact, both eyes  Ears:    Normal external ear canals, both ears  Nose:   Nares normal, septum midline, mucosa normal, no drainage or sinus tenderness  Throat:   Lips, mucosa, and tongue normal; teeth and gums normal  Neck:   Supple, symmetrical, trachea midline, no adenopathy; thyroid: no enlargement/tenderness/nodules; no carotid bruit or JVD  Back:     Symmetric, no curvature, ROM normal, no CVA tenderness  Lungs:     Clear to auscultation bilaterally, respirations unlabored  Chest Wall:    No tenderness or deformity   Heart:    Regular rate and  rhythm, S1 and S2 normal, no murmur, rub or gallop  Breast Exam:    No tenderness, masses, or nipple abnormality  Abdomen:     Soft, non-tender, bowel sounds active all four quadrants, no masses, no organomegaly.    Genitalia:    Pelvic:external genitalia normal, vagina without lesions, discharge, or tenderness, rectovaginal septum  normal. Cervix normal in appearance, no cervical motion tenderness, no adnexal masses or tenderness.  Uterus normal size, shape, mobile, regular contours, nontender.  Rectal:    Normal external sphincter.  No hemorrhoids appreciated. Internal exam not done.   Extremities:   Extremities normal, atraumatic, no cyanosis or edema  Pulses:   2+ and symmetric all extremities  Skin:   Skin color, texture, turgor normal, no rashes or lesions  Lymph nodes:   Cervical, supraclavicular, and axillary nodes normal  Neurologic:   CNII-XII intact, normal strength, sensation and reflexes throughout   .  Labs:  Lab Results  Component Value Date   WBC 6.0 10/17/2018   HGB 13.9 10/17/2018   HCT 42.8 10/17/2018   MCV 84 10/17/2018   PLT 331 10/17/2018    Lab Results  Component Value Date   CREATININE 0.78 03/18/2019   BUN 14 03/18/2019   NA 140 03/18/2019   K 4.9 03/18/2019   CL 101 03/18/2019   CO2 26 03/18/2019    Lab Results  Component Value Date   ALT 16 03/18/2019   AST 14 03/18/2019   ALKPHOS 94 03/18/2019   BILITOT 0.4 03/18/2019    Lab Results  Component Value Date   TSH 0.790 10/17/2018    Assessment:    Encounter for well woman exam with routine gynecological exam Breast cancer screening by mammogram Screening for lipid disorder History of vitamin D deficiency Diabetes mellitus without complication ([place]) Overweight (BMI 25.0-29.9)  Plan:    - Blood tests: CBC with diff, Comprehensive metabolic panel, Lipoproteins, TSH and Vitamin D. - Breast self exam technique reviewed and patient encouraged to perform self-exam monthly. - Contraception:  IUD. - Discussed healthy lifestyle modifications. - Mammogram ordered. - Pap smear up to date.   - Discussion had with patient  regarding general questions of BV infections - Up to date on flu vaccine.  - Answered questions regarding COVID-19 vaccination.  - Discussed weight concerns, advised that she can discuss with her PCP/Endocrinologist with regards to weight management with medication such as Saxenda which could help to control weight as well as management with diabetes.     Rubie Maid, MD [facility]

## 2019-12-17 NOTE — Patient Instructions (Incomplete)
Preventive Care 40-48 Years Old, Female Preventive care refers to visits with your health care provider and lifestyle choices that can promote health and wellness. This includes:  A yearly physical exam. This may also be called an annual well check.  Regular dental visits and eye exams.  Immunizations.  Screening for certain conditions.  Healthy lifestyle choices, such as eating a healthy diet, getting regular exercise, not using drugs or products that contain nicotine and tobacco, and limiting alcohol use. What can I expect for my preventive care visit? Physical exam Your health care provider will check your:  Height and weight. This may be used to calculate body mass index (BMI), which tells if you are at a healthy weight.  Heart rate and blood pressure.  Skin for abnormal spots. Counseling Your health care provider may ask you questions about your:  Alcohol, tobacco, and drug use.  Emotional well-being.  Home and relationship well-being.  Sexual activity.  Eating habits.  Work and work environment.  Method of birth control.  Menstrual cycle.  Pregnancy history. What immunizations do I need?  Influenza (flu) vaccine  This is recommended every year. Tetanus, diphtheria, and pertussis (Tdap) vaccine  You may need a Td booster every 10 years. Varicella (chickenpox) vaccine  You may need this if you have not been vaccinated. Zoster (shingles) vaccine  You may need this after age 60. Measles, mumps, and rubella (MMR) vaccine  You may need at least one dose of MMR if you were born in 1957 or later. You may also need a second dose. Pneumococcal conjugate (PCV13) vaccine  You may need this if you have certain conditions and were not previously vaccinated. Pneumococcal polysaccharide (PPSV23) vaccine  You may need one or two doses if you smoke cigarettes or if you have certain conditions. Meningococcal conjugate (MenACWY) vaccine  You may need this if you  have certain conditions. Hepatitis A vaccine  You may need this if you have certain conditions or if you travel or work in places where you may be exposed to hepatitis A. Hepatitis B vaccine  You may need this if you have certain conditions or if you travel or work in places where you may be exposed to hepatitis B. Haemophilus influenzae type b (Hib) vaccine  You may need this if you have certain conditions. Human papillomavirus (HPV) vaccine  If recommended by your health care provider, you may need three doses over 6 months. You may receive vaccines as individual doses or as more than one vaccine together in one shot (combination vaccines). Talk with your health care provider about the risks and benefits of combination vaccines. What tests do I need? Blood tests  Lipid and cholesterol levels. These may be checked every 5 years, or more frequently if you are over 50 years old.  Hepatitis C test.  Hepatitis B test. Screening  Lung cancer screening. You may have this screening every year starting at age 55 if you have a 30-pack-year history of smoking and currently smoke or have quit within the past 15 years.  Colorectal cancer screening. All adults should have this screening starting at age 50 and continuing until age 75. Your health care provider may recommend screening at age 45 if you are at increased risk. You will have tests every 1-10 years, depending on your results and the type of screening test.  Diabetes screening. This is done by checking your blood sugar (glucose) after you have not eaten for a while (fasting). You may have this   done every 1-3 years.  Mammogram. This may be done every 1-2 years. Talk with your health care provider about when you should start having regular mammograms. This may depend on whether you have a family history of breast cancer.  BRCA-related cancer screening. This may be done if you have a family history of breast, ovarian, tubal, or peritoneal  cancers.  Pelvic exam and Pap test. This may be done every 3 years starting at age 60. Starting at age 7, this may be done every 5 years if you have a Pap test in combination with an HPV test. Other tests  Sexually transmitted disease (STD) testing.  Bone density scan. This is done to screen for osteoporosis. You may have this scan if you are at high risk for osteoporosis. Follow these instructions at home: Eating and drinking  Eat a diet that includes fresh fruits and vegetables, whole grains, lean protein, and low-fat dairy.  Take vitamin and mineral supplements as recommended by your health care provider.  Do not drink alcohol if: ? Your health care provider tells you not to drink. ? You are pregnant, may be pregnant, or are planning to become pregnant.  If you drink alcohol: ? Limit how much you have to 0-1 drink a day. ? Be aware of how much alcohol is in your drink. In the [place] one drink equals one 12 oz bottle of beer (355 mL), one 5 oz glass of wine (148 mL), or one 1 oz glass of hard liquor (44 mL). Lifestyle  Take daily care of your teeth and gums.  Stay active. Exercise for at least 30 minutes on 5 or more days each week.  Do not use any products that contain nicotine or tobacco, such as cigarettes, e-cigarettes, and chewing tobacco. If you need help quitting, ask your health care provider.  If you are sexually active, practice safe sex. Use a condom or other form of birth control (contraception) in order to prevent pregnancy and STIs (sexually transmitted infections).  If told by your health care provider, take low-dose aspirin daily starting at age 48. What's next?  Visit your health care provider once a year for a well check visit.  Ask your health care provider how often you should have your eyes and teeth checked.  Stay up to date on all vaccines. This information is not intended to replace advice given to you by your health care provider. Make sure you  discuss any questions you have with your health care provider. Document Revised: 06/12/2018 Document Reviewed: 06/12/2018 Elsevier Patient Education  2020 [place] Breast self-awareness is knowing how your breasts look and feel. Doing breast self-awareness is important. It allows you to catch a breast problem early while it is still small and can be treated. All women should do breast self-awareness, including women who have had breast implants. Tell your doctor if you notice a change in your breasts. What you need:  A mirror.  A well-lit room. How to do a breast self-exam A breast self-exam is one way to learn what is normal for your breasts and to check for changes. To do a breast self-exam: Look for changes  1. Take off all the clothes above your waist. 2. Stand in front of a mirror in a room with good lighting. 3. Put your hands on your hips. 4. Push your hands down. 5. Look at your breasts and nipples in the mirror to see if one breast or nipple looks different from the  other. Check to see if: ? The shape of one breast is different. ? The size of one breast is different. ? There are wrinkles, dips, and bumps in one breast and not the other. 6. Look at each breast for changes in the skin, such as: ? Redness. ? Scaly areas. 7. Look for changes in your nipples, such as: ? Liquid around the nipples. ? Bleeding. ? Dimpling. ? Redness. ? A change in where the nipples are. Feel for changes  1. Lie on your back on the floor. 2. Feel each breast. To do this, follow these steps: ? Pick a breast to feel. ? Put the arm closest to that breast above your head. ? Use your other arm to feel the nipple area of your breast. Feel the area with the pads of your three middle fingers by making small circles with your fingers. For the first circle, press lightly. For the second circle, press harder. For the third circle, press even harder. ? Keep making circles with  your fingers at the different pressures as you move down your breast. Stop when you feel your ribs. ? Move your fingers a little toward the center of your body. ? Start making circles with your fingers again, this time going up until you reach your collarbone. ? Keep making up-and-down circles until you reach your armpit. Remember to keep using the three pressures. ? Feel the other breast in the same way. 3. Sit or stand in the tub or shower. 4. With soapy water on your skin, feel each breast the same way you did in step 2 when you were lying on the floor. Write down what you find Writing down what you find can help you remember what to tell your doctor. Write down:  What is normal for each breast.  Any changes you find in each breast, including: ? The kind of changes you find. ? Whether you have pain. ? Size and location of any lumps.  When you last had your menstrual period. General tips  Check your breasts every month.  If you are breastfeeding, the best time to check your breasts is after you feed your baby or after you use a breast pump.  If you get menstrual periods, the best time to check your breasts is 5-7 days after your menstrual period is over.  With time, you will become comfortable with the self-exam, and you will begin to know if there are changes in your breasts. Contact a doctor if you:  See a change in the shape or size of your breasts or nipples.  See a change in the skin of your breast or nipples, such as red or scaly skin.  Have fluid coming from your nipples that is not normal.  Find a lump or thick area that was not there before.  Have pain in your breasts.  Have any concerns about your breast health. Summary  Breast self-awareness includes looking for changes in your breasts, as well as feeling for changes within your breasts.  Breast self-awareness should be done in front of a mirror in a well-lit room.  You should check your breasts every month.  If you get menstrual periods, the best time to check your breasts is 5-7 days after your menstrual period is over.  Let your doctor know of any changes you see in your breasts, including changes in size, changes on the skin, pain or tenderness, or fluid from your nipples that is not normal. This information is not  intended to replace advice given to you by your health care provider. Make sure you discuss any questions you have with your health care provider. Document Revised: 05/20/2018 Document Reviewed: 05/20/2018 Elsevier Patient Education  [place].

## 2019-12-21 ENCOUNTER — Other Ambulatory Visit: Payer: Commercial Managed Care - PPO

## 2019-12-21 ENCOUNTER — Other Ambulatory Visit: Payer: Self-pay

## 2019-12-22 LAB — LIPID PANEL
Chol/HDL Ratio: 2.4 ratio (ref 0.0–4.4)
Cholesterol, Total: 144 mg/dL (ref 100–199)
HDL: 60 mg/dL (ref >39)
LDL Chol Calc (NIH): 74 mg/dL (ref 0–99)
Triglycerides: 46 mg/dL (ref 0–149)
VLDL Cholesterol Cal: 10 mg/dL (ref 5–40)

## 2019-12-22 LAB — TSH: TSH: 1.32 u[IU]/mL (ref 0.450–4.500)

## 2019-12-22 LAB — VITAMIN D 25 HYDROXY (VIT D DEFICIENCY, FRACTURES): Vit D, 25-Hydroxy: 31.3 ng/mL (ref 30.0–100.0)

## 2019-12-22 LAB — CBC
Hematocrit: 41.2 % (ref 34.0–46.6)
Hemoglobin: 13.3 g/dL (ref 11.1–15.9)
MCH: 28.2 pg (ref 26.6–33.0)
MCHC: 32.3 g/dL (ref 31.5–35.7)
MCV: 87 fL (ref 79–97)
Platelets: 312 10*3/uL (ref 150–450)
RBC: 4.72 x10E6/uL (ref 3.77–5.28)
RDW: 13.5 % (ref 11.7–15.4)
WBC: 6.3 10*3/uL (ref 3.4–10.8)

## 2019-12-22 LAB — HEMOGLOBIN A1C
Est. average glucose Bld gHb Est-mCnc: 200 mg/dL
Hgb A1c MFr Bld: 8.6 % — ABNORMAL HIGH (ref 4.8–5.6)

## 2020-01-20 ENCOUNTER — Ambulatory Visit
Admission: RE | Admit: 2020-01-20 | Discharge: 2020-01-20 | Disposition: A | Discharge status: Home / Self Care | Payer: Commercial Managed Care - PPO | Source: Ambulatory Visit | Attending: Obstetrics and Gynecology | Admitting: Obstetrics and Gynecology

## 2020-01-20 DIAGNOSIS — Z1231 Encounter for screening mammogram for malignant neoplasm of breast: Secondary | ICD-10-CM

## 2020-02-12 ENCOUNTER — Telehealth: Payer: Self-pay

## 2020-02-12 ENCOUNTER — Other Ambulatory Visit: Payer: Self-pay

## 2020-02-12 ENCOUNTER — Ambulatory Visit (INDEPENDENT_AMBULATORY_CARE_PROVIDER_SITE_OTHER): Payer: Commercial Managed Care - PPO | Admitting: Obstetrics and Gynecology

## 2020-02-12 ENCOUNTER — Encounter: Payer: Self-pay | Admitting: Obstetrics and Gynecology

## 2020-02-12 VITALS — BP 110/66 | HR 97 | Ht 67.0 in | Wt 200.0 lb

## 2020-02-12 DIAGNOSIS — Z538 Procedure and treatment not carried out for other reasons: Secondary | ICD-10-CM

## 2020-02-12 NOTE — Telephone Encounter (Signed)
Pt was on your schedule for weight management. I did her vitals. She is currently 200lbs at 5'7". Please advise or call her at your convenience.

## 2020-02-15 ENCOUNTER — Other Ambulatory Visit: Payer: Self-pay | Admitting: Obstetrics and Gynecology

## 2020-02-15 MED ORDER — PHENTERMINE HCL 37.5 MG PO TABS: ORAL_TABLET | ORAL | 0 refills | Status: DC

## 2020-02-15 NOTE — Progress Notes (Signed)
Current weight 200lbs, patient was rescheduled secondary to myself being in surgery on 02/12/2020, did come and get weight and vitals. Current weight 200lbs.  Has been relatively stable last phentermine course 06/19/2019.

## 2020-02-16 ENCOUNTER — Telehealth: Payer: Self-pay | Admitting: Obstetrics and Gynecology

## 2020-02-16 NOTE — Telephone Encounter (Signed)
Patient is schedule for 03/25/20 with AMS for telephone visit

## 2020-02-16 NOTE — Telephone Encounter (Signed)
-----   Message from [name], MD sent at 02/15/2020  8:36 AM EDT ----- Needs medication follow up in 4 weeks.  Telephone, video, or in person is ok   [name], MD, [place], [place] 02/15/2020, 8:37 AM

## 2020-02-17 DIAGNOSIS — E1169 Type 2 diabetes mellitus with other specified complication: Secondary | ICD-10-CM | POA: Insufficient documentation

## 2020-03-15 ENCOUNTER — Other Ambulatory Visit: Payer: Self-pay | Admitting: Obstetrics and Gynecology

## 2020-03-15 NOTE — Telephone Encounter (Signed)
Pt has a follow up appointment with you on 6/11. Please advise

## 2020-03-25 ENCOUNTER — Ambulatory Visit (INDEPENDENT_AMBULATORY_CARE_PROVIDER_SITE_OTHER): Payer: Commercial Managed Care - PPO | Admitting: Obstetrics and Gynecology

## 2020-03-25 ENCOUNTER — Encounter: Payer: Self-pay | Admitting: Obstetrics and Gynecology

## 2020-03-25 ENCOUNTER — Other Ambulatory Visit: Payer: Self-pay

## 2020-03-25 VITALS — BP 128/72 | Ht 67.0 in | Wt 192.0 lb

## 2020-03-25 DIAGNOSIS — E669 Obesity, unspecified: Secondary | ICD-10-CM

## 2020-03-25 DIAGNOSIS — Z6830 Body mass index (BMI) 30.0-30.9, adult: Secondary | ICD-10-CM

## 2020-03-25 NOTE — Progress Notes (Signed)
Gynecology Office Visit  Chief Complaint:  Chief Complaint  Patient presents with  . Weight Check    History of Present Illness: Patientis a 49 y.o. G65P1011 female, who presents for the evaluation of the desire to lose weight. She has lost 8 pounds 1 months. The patient states the following symptoms since starting her weight loss therapy: appetite suppression, energy, and weight loss.  The patient also reports no other ill effects. The patient specifically denies heart palpitations, anxiety, and insomnia.    Review of Systems: 10 point review of systems negative unless otherwise noted in HPI  Past Medical History:  Past Medical History:  Diagnosis Date  . 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 Anxiety   . Diabetes mellitus without complication ([place])   . Genital herpes   . Goiter   . High risk human papilloma cervical smear 2015  . History of abnormal cervical Pap smear    POsitive HRHPV  . Insomnia     Past Surgical History:  Past Surgical History:  Procedure Laterality Date  . BIOPSY THYROID  01/18/2014   B9 FNA  . BREAST BIOPSY  08/2010   Left benign   Fibroadenoma  . CESAREAN SECTION  05/2000   Failed ECV..Breech  . CHOLECYSTECTOMY    . COLPOSCOPY    . COMBINED HYSTEROSCOPY DIAGNOSTIC / D&C    . INTRAUTERINE DEVICE (IUD) INSERTION N/A 10/10/2018   Procedure: INTRAUTERINE DEVICE (IUD) INSERTION;  Surgeon: Rubie Maid, MD;  Location: [facility] OR[facility];  Service: Gynecology;  Laterality: N/A;  . INTRAUTERINE DEVICE INSERTION  08/25/2013  . IUD REMOVAL N/A 10/10/2018   Procedure: INTRAUTERINE DEVICE (IUD) REMOVAL;  Surgeon: Rubie Maid, MD;  Location: [facility];  Service: Gynecology;  Laterality: N/A;  CERVICAL DILATION    Gynecologic History: No LMP recorded. (Menstrual status: IUD).  Obstetric History: G2P1011  Family History:  Family History  Problem Relation Age of Onset  . Diabetes Maternal Grandmother        Type 2  . Healthy Father   . Healthy Mother   .  Cancer Neg Hx   . Hypertension Neg Hx   . Stroke Neg Hx   . Thyroid disease Neg Hx   . Breast cancer Neg Hx     Social History:  Social History   Socioeconomic History  . Marital status: Single    Spouse name: Not on file  . Number of children: 1  . Years of education: Not on file  . Highest education level: Not on file  Occupational History  . Not on file  Tobacco Use  . Smoking status: Never Smoker  . Smokeless tobacco: Never Used  Vaping Use  . Vaping Use: Never used  Substance and Sexual Activity  . Alcohol use: Yes  . Drug use: No  . Sexual activity: Not Currently    Partners: Male    Birth control/protection: I.U.D.  Other Topics Concern  . Not on file  Social History Narrative  . Not on file   Social Determinants of Health   Financial Resource Strain:   . Difficulty of Paying Living Expenses:   Food Insecurity:   . Worried About Charity fundraiser in the Last Year:   . Arboriculturist in the Last Year:   Transportation Needs:   . Film/video editor (Medical):   Marland Kitchen Lack of Transportation (Non-Medical):   Physical Activity:   . Days of Exercise per Week:   . Minutes of Exercise  per Session:   Stress:   . Feeling of Stress :   Social Connections:   . Frequency of Communication with Friends and Family:   . Frequency of Social Gatherings with Friends and Family:   . Attends Religious Services:   . Active Member of Clubs or Organizations:   . Attends Archivist Meetings:   Marland Kitchen Marital Status:   Intimate Partner Violence:   . Fear of Current or Ex-Partner:   . Emotionally Abused:   Marland Kitchen Physically Abused:   . Sexually Abused:     Allergies:  No Known Allergies  Medications: Prior to Admission medications   Medication Sig Start Date End Date Taking? Authorizing Provider  Blood Glucose Monitoring Suppl (GLUCOCOM BLOOD GLUCOSE MONITOR) DEVI One Touch Verio meter. Use to check sugar once daily. 12/19/16   [provider]    cyanocobalamin (,VITAMIN B-12,) 1000 MCG/ML injection Inject 1 mL (1,000 mcg total) into the muscle every 30 (thirty) days. Patient not taking: Reported on 12/17/2019 04/18/18   Joylene Igo, CNM  Dulaglutide (TRULICITY) 6.23 JS/2.8BT SOPN Inject 0.75 mg into the skin every Tuesday.     [provider]  glucose blood ([employer] TEST STRIPS) test strip Use once daily. 02/02/15   [provider]  ibuprofen (ADVIL,MOTRIN) 800 MG tablet Take 1 tablet (800 mg total) by mouth every 8 (eight) hours as needed. 10/10/18   Rubie Maid, MD  insulin glargine (LANTUS) 100 UNIT/ML injection Inject into the skin daily. 18 units nightly    [provider]  phentermine (ADIPEX-P) 37.5 MG tablet Take 1 tablet by mouth every day before breakfast 03/15/20   [name], MD  pravastatin (PRAVACHOL) 20 MG tablet Take 20 mg by mouth daily.    [provider]    Physical Exam There were no vitals taken for this visit. Wt Readings from Last 3 Encounters:  03/25/20 192 lb (87.1 kg)  02/12/20 200 lb (90.7 kg)  12/17/19 188 lb (85.3 kg)  Body mass index is 30.07 kg/m.   General: NAD HEENT: normocephalic, anicteric Thyroid: no enlargement Pulmonary: no increased work of breathing Neurologic: Grossly intact Psychiatric: mood appropriate, affect full  Assessment: 49 y.o. G2P1011 follow of medical weight loss Plan: Problem List Items Addressed This Visit    None    Visit Diagnoses    Class 1 obesity with serious comorbidity and body mass index (BMI) of 30.0 to 30.9 in adult, unspecified obesity type    -  Primary      1) 1500 Calorie ADA Diet  2) Patient education given regarding appropriate lifestyle changes for weight loss including: regular physical activity, healthy coping strategies, caloric restriction and healthy eating patterns.  3) Patient will be started on weight loss medication. The risks and benefits and side effects of medication, such as Adipex  (Phenteramine) ,  Tenuate (Diethylproprion), Belviq (lorcarsin), Contrave (buproprion/naltrexone), Qsymia (phentermine/topiramate), and Saxenda (liraglutide) is discussed. The pros and cons of suppressing appetite and boosting metabolism is discussed. Risks of tolerence and addiction is discussed for selected agents discussed. Use of medicine will ne short term, such as 3-4 months at a time followed by a period of time off of the medicine to avoid these risks and side effects for Adipex, Qsymia, and Tenuate discussed. Pt to call with any negative side effects and agrees to keep follow up appts.  4) Patient to take medication, with the benefits of appetite suppression and metabolism boost d/w pt, along with the side effects and risk  factors of long term use that will be avoided with our use of short bursts of therapy. Rx provided.    5) 15 minutes face-to-face; with counseling/coordination of care > 50 percent of visit related to obesity and ongoing management/treatment   6)  Return in about 3 weeks (around 04/15/2020) for phone visit.    [name], MD, [facility], [facility] 03/25/2020, 8:31 AM  '

## 2020-04-14 ENCOUNTER — Other Ambulatory Visit: Payer: Self-pay | Admitting: Obstetrics and Gynecology

## 2020-04-14 NOTE — Telephone Encounter (Signed)
Advise, I tried to refill it but it said I was not authorized

## 2020-04-15 ENCOUNTER — Other Ambulatory Visit: Payer: Self-pay | Admitting: Obstetrics and Gynecology

## 2020-04-19 ENCOUNTER — Other Ambulatory Visit: Payer: Self-pay

## 2020-04-25 NOTE — Telephone Encounter (Signed)
Need current weight

## 2020-04-27 ENCOUNTER — Encounter: Payer: Self-pay | Admitting: Obstetrics and Gynecology

## 2020-04-27 ENCOUNTER — Ambulatory Visit (INDEPENDENT_AMBULATORY_CARE_PROVIDER_SITE_OTHER): Payer: Commercial Managed Care - PPO | Admitting: Obstetrics and Gynecology

## 2020-04-27 ENCOUNTER — Other Ambulatory Visit: Payer: Self-pay

## 2020-04-27 VITALS — Ht 66.0 in | Wt 196.4 lb

## 2020-04-27 DIAGNOSIS — Z6830 Body mass index (BMI) 30.0-30.9, adult: Secondary | ICD-10-CM

## 2020-04-27 DIAGNOSIS — E669 Obesity, unspecified: Secondary | ICD-10-CM

## 2020-04-27 MED ORDER — PHENTERMINE HCL 37.5 MG PO TABS: 37.5000 mg | ORAL_TABLET | Freq: Every day | ORAL | 0 refills | Status: DC

## 2020-04-27 NOTE — Progress Notes (Signed)
I connected with Jillian Foster on 04/27/20 at 10:30 AM EDT by telephone and verified that I am speaking with the correct person using two identifiers.   I discussed the limitations, risks, security and privacy concerns of performing an evaluation and management service by telephone and the availability of in person appointments. I also discussed with the patient that there may be a patient responsible charge related to this service. The patient expressed understanding and agreed to proceed.  The patient was at home I spoke with the patient from my workstation phone The names of people involved in this encounter were: Jillian Foster , and Jillian Foster   Gynecology Office Visit  Chief Complaint:  Chief Complaint  Patient presents with  . Weight Check    History of Present Illness: Patientis a 49 y.o. G31P1011 female, who presents for the evaluation of the desire to lose weight. She has gained 4 pounds 1 months. The patient states the following symptoms since starting her weight loss therapy: appetite suppression, energy, and weight loss.  The patient also reports no other ill effects. The patient specifically denies heart palpitations, anxiety, and insomnia.    Review of Systems: 10 point review of systems negative unless otherwise noted in HPI  Past Medical History:  Past Medical History:  Diagnosis Date  . 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 Anxiety   . Diabetes mellitus without complication ([place])   . Genital herpes   . Goiter   . High risk human papilloma cervical smear 2015  . History of abnormal cervical Pap smear    POsitive HRHPV  . Insomnia     Past Surgical History:  Past Surgical History:  Procedure Laterality Date  . BIOPSY THYROID  01/18/2014   B9 FNA  . BREAST BIOPSY  08/2010   Left benign   Fibroadenoma  . CESAREAN SECTION  05/2000   Failed ECV..Breech  . CHOLECYSTECTOMY    . COLPOSCOPY    . COMBINED HYSTEROSCOPY DIAGNOSTIC / D&C     . INTRAUTERINE DEVICE (IUD) INSERTION N/A 10/10/2018   Procedure: INTRAUTERINE DEVICE (IUD) INSERTION;  Surgeon: Rubie Maid, MD;  Location: [facility];  Service: Gynecology;  Laterality: N/A;  . INTRAUTERINE DEVICE INSERTION  08/25/2013  . IUD REMOVAL N/A 10/10/2018   Procedure: INTRAUTERINE DEVICE (IUD) REMOVAL;  Surgeon: Rubie Maid, MD;  Location: [facility];  Service: Gynecology;  Laterality: N/A;  CERVICAL DILATION    Gynecologic History: No LMP recorded. (Menstrual status: IUD).  Obstetric History: G2P1011  Family History:  Family History  Problem Relation Age of Onset  . Diabetes Maternal Grandmother        Type 2  . Healthy Father   . Healthy Mother   . Cancer Neg Hx   . Hypertension Neg Hx   . Stroke Neg Hx   . Thyroid disease Neg Hx   . Breast cancer Neg Hx     Social History:  Social History   Socioeconomic History  . Marital status: Single    Spouse name: Not on file  . Number of children: 1  . Years of education: Not on file  . Highest education level: Not on file  Occupational History  . Not on file  Tobacco Use  . Smoking status: Never Smoker  . Smokeless tobacco: Never Used  Vaping Use  . Vaping Use: Never used  Substance and Sexual Activity  . Alcohol use: Yes  . Drug use: No  . Sexual activity: Not Currently  Partners: Male    Birth control/protection: I.U.D.  Other Topics Concern  . Not on file  Social History Narrative  . Not on file   Social Determinants of Health   Financial Resource Strain:   . Difficulty of Paying Living Expenses:   Food Insecurity:   . Worried About Charity fundraiser in the Last Year:   . Arboriculturist in the Last Year:   Transportation Needs:   . Film/video editor (Medical):   Marland Kitchen Lack of Transportation (Non-Medical):   Physical Activity:   . Days of Exercise per Week:   . Minutes of Exercise per Session:   Stress:   . Feeling of Stress :   Social Connections:   . Frequency of Communication  with Friends and Family:   . Frequency of Social Gatherings with Friends and Family:   . Attends Religious Services:   . Active Member of Clubs or Organizations:   . Attends Archivist Meetings:   Marland Kitchen Marital Status:   Intimate Partner Violence:   . Fear of Current or Ex-Partner:   . Emotionally Abused:   Marland Kitchen Physically Abused:   . Sexually Abused:     Allergies:  No Known Allergies  Medications: Prior to Admission medications   Medication Sig Start Date End Date Taking? Authorizing Provider  Blood Glucose Monitoring Suppl (GLUCOCOM BLOOD GLUCOSE MONITOR) DEVI One Touch Verio meter. Use to check sugar once daily. 12/19/16   [provider]  cyanocobalamin (,VITAMIN B-12,) 1000 MCG/ML injection Inject 1 mL (1,000 mcg total) into the muscle every 30 (thirty) days. 04/18/18   Shambley, Melody N, CNM  Dulaglutide (TRULICITY) 5.80 DX/8.3JA SOPN Inject 0.75 mg into the skin every Tuesday.     [provider]  glucose blood ([employer] TEST STRIPS) test strip Use once daily. 02/02/15   [provider]  ibuprofen (ADVIL,MOTRIN) 800 MG tablet Take 1 tablet (800 mg total) by mouth every 8 (eight) hours as needed. 10/10/18   Rubie Maid, MD  insulin glargine (LANTUS) 100 UNIT/ML injection Inject into the skin daily. 18 units nightly    [provider]  phentermine (ADIPEX-P) 37.5 MG tablet Take 1 tablet by mouth every day before breakfast 03/15/20   Jillian Mood, MD  pravastatin (PRAVACHOL) 20 MG tablet Take 20 mg by mouth daily.    [provider]    Physical Exam Height 5' 6"  (1.676 m), weight 196 lb 6.4 oz (89.1 kg). Wt Readings from Last 3 Encounters:  04/27/20 196 lb 6.4 oz (89.1 kg)  03/25/20 192 lb (87.1 kg)  02/12/20 200 lb (90.7 kg)  Body mass index is 31.7 kg/m.  No physical exam as this was a remote telephone visit to promote social distancing during the current COVID-19 Pandemic   Assessment: 49 y.o. G2P1011 medical weight loss  follow up  Plan: Problem List Items Addressed This Visit    None    Visit Diagnoses    Class 1 obesity with serious comorbidity and body mass index (BMI) of 30.0 to 30.9 in adult, unspecified obesity type    -  Primary   Relevant Medications   phentermine (ADIPEX-P) 37.5 MG tablet      1) 1500 Calorie ADA Diet  2) Patient education given regarding appropriate lifestyle changes for weight loss including: regular physical activity, healthy coping strategies, caloric restriction and healthy eating patterns.  3) Patient to take medication, with the benefits of appetite suppression and metabolism boost d/w pt, along with the  side effects and risk factors of long term use that will be avoided with our use of short bursts of therapy. Rx provided.  - previously tried Contrave with intolerance secondary to nausea - on Trulicity for DM II   4) Telephone Time 5:56mn  5)  Return in about 4 weeks (around 05/25/2020) for medication follow up phone.    AMalachy Mood MD, [facility], [facility] 04/27/2020, 11:28 AM

## 2020-05-25 ENCOUNTER — Other Ambulatory Visit: Payer: Self-pay

## 2020-05-25 ENCOUNTER — Encounter: Payer: Self-pay | Admitting: Obstetrics and Gynecology

## 2020-05-25 ENCOUNTER — Ambulatory Visit (INDEPENDENT_AMBULATORY_CARE_PROVIDER_SITE_OTHER): Payer: Commercial Managed Care - PPO | Admitting: Obstetrics and Gynecology

## 2020-05-25 VITALS — Wt 188.0 lb

## 2020-05-25 DIAGNOSIS — E669 Obesity, unspecified: Secondary | ICD-10-CM

## 2020-05-25 DIAGNOSIS — Z6830 Body mass index (BMI) 30.0-30.9, adult: Secondary | ICD-10-CM

## 2020-05-25 MED ORDER — PHENTERMINE HCL 37.5 MG PO TABS: 37.5000 mg | ORAL_TABLET | Freq: Every day | ORAL | 0 refills | Status: DC

## 2020-05-25 NOTE — Progress Notes (Signed)
I connected with Jillian Foster   on 05/25/20 at  9:50 AM EDT by telephone and verified that I am speaking with the correct person using two identifiers.   I discussed the limitations, risks, security and privacy concerns of performing an evaluation and management service by telephone and the availability of in person appointments. I also discussed with the patient that there may be a patient responsible charge related to this service. The patient expressed understanding and agreed to proceed.  The patient was at home I spoke with the patient from my workstation phone The names of people involved in this encounter were: Jillian Foster , and Jillian Foster   Gynecology Office Visit  Chief Complaint:  Chief Complaint  Patient presents with   Weight Check    History of Present Illness: Patientis a 49 y.o. G54P1011 female, who presents for the evaluation of the desire to lose weight. She has lost 8 pounds 1 months. The patient states the following symptoms since starting her weight loss therapy: appetite suppression, energy, and weight loss.  The patient also reports no other ill effects. The patient specifically denies heart palpitations, anxiety, and insomnia.    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    Anxiety    Diabetes mellitus without complication ([facility])    Genital herpes    Goiter    High risk human papilloma cervical smear 2015   History of abnormal cervical Pap smear    POsitive HRHPV   Insomnia     Past Surgical History:  Past Surgical History:  Procedure Laterality Date   BIOPSY THYROID  01/18/2014   B9 FNA   BREAST BIOPSY  08/2010   Left benign   Fibroadenoma   CESAREAN SECTION  05/2000   Failed ECV..Breech   CHOLECYSTECTOMY     COLPOSCOPY     COMBINED HYSTEROSCOPY DIAGNOSTIC / D&C      INTRAUTERINE DEVICE (IUD) INSERTION N/A 10/10/2018   Procedure: INTRAUTERINE DEVICE (IUD) INSERTION;  Surgeon: Rubie Maid, MD;  Location: [facility];  Service: Gynecology;  Laterality: N/A;   INTRAUTERINE DEVICE INSERTION  08/25/2013   IUD REMOVAL N/A 10/10/2018   Procedure: INTRAUTERINE DEVICE (IUD) REMOVAL;  Surgeon: Rubie Maid, MD;  Location: [facility];  Service: Gynecology;  Laterality: N/A;  CERVICAL DILATION    Gynecologic History: No LMP recorded. (Menstrual status: IUD).  Obstetric History: G2P1011  Family History:  Family History  Problem Relation Age of Onset   Diabetes Maternal Grandmother        Type 2   Healthy Father    Healthy Mother    Cancer Neg Hx    Hypertension Neg Hx    Stroke Neg Hx    Thyroid disease Neg Hx    Breast cancer Neg Hx     Social History:  Social History   Socioeconomic History   Marital status: Single    Spouse name: Not on file   Number of children: 1   Years of education: Not on file   Highest education level: Not on file  Occupational History   Not on file  Tobacco Use   Smoking status: Never Smoker   Smokeless tobacco: Never Used  Vaping Use   Vaping Use: Never used  Substance and Sexual Activity   Alcohol use: Yes   Drug use: No   Sexual  activity: Not Currently    Partners: Male    Birth control/protection: I.U.D.  Other Topics Concern   Not on file  Social History Narrative   Not on file   Social Determinants of Health   Financial Resource Strain:    Difficulty of Paying Living Expenses:   Food Insecurity:    Worried About Charity fundraiser in the Last Year:    Arboriculturist in the Last Year:   Transportation Needs:    Film/video editor (Medical):    Lack of Transportation (Non-Medical):   Physical Activity:    Days of Exercise per Week:    Minutes of Exercise per Session:   Stress:    Feeling of Stress :   Social Connections:    Frequency of Communication  with Friends and Family:    Frequency of Social Gatherings with Friends and Family:    Attends Religious Services:    Active Member of Clubs or Organizations:    Attends Archivist Meetings:    Marital Status:   Intimate Partner Violence:    Fear of Current or Ex-Partner:    Emotionally Abused:    Physically Abused:    Sexually Abused:     Allergies:  No Known Allergies  Medications: Prior to Admission medications   Medication Sig Start Date End Date Taking? Authorizing Provider  Blood Glucose Monitoring Suppl (GLUCOCOM BLOOD GLUCOSE MONITOR) DEVI One Touch Verio meter. Use to check sugar once daily. 12/19/16  Yes [provider]  cyanocobalamin (,VITAMIN B-12,) 1000 MCG/ML injection Inject 1 mL (1,000 mcg total) into the muscle every 30 (thirty) days. 04/18/18  Yes Shambley, Melody N, CNM  Dulaglutide (TRULICITY) 1.54 MG/8.6PY SOPN Inject 0.75 mg into the skin every Tuesday.    Yes [provider]  glucose blood ([employer] TEST STRIPS) test strip Use once daily. 02/02/15  Yes [provider]  ibuprofen (ADVIL,MOTRIN) 800 MG tablet Take 1 tablet (800 mg total) by mouth every 8 (eight) hours as needed. 10/10/18  Yes Rubie Maid, MD  insulin glargine (LANTUS) 100 UNIT/ML injection Inject into the skin daily. 18 units nightly   Yes [provider]  phentermine (ADIPEX-P) 37.5 MG tablet Take 1 tablet (37.5 mg total) by mouth daily before breakfast. 04/27/20  Yes Jillian Mood, MD  pravastatin (PRAVACHOL) 20 MG tablet Take 20 mg by mouth daily.   Yes [provider]    Physical Exam Weight 188 lb (85.3 kg). Wt Readings from Last 3 Encounters:  05/25/20 188 lb (85.3 kg)  04/27/20 196 lb 6.4 oz (89.1 kg)  03/25/20 192 lb (87.1 kg)  Body mass index is 30.34 kg/m.  No physical exam as this was a remote telephone visit to promote social distancing during the current COVID-19 Pandemic   Assessment: 49 y.o. G2P1011 medical  weight loss follow  Plan: Problem List Items Addressed This Visit    None    Visit Diagnoses    Class 1 obesity with serious comorbidity and body mass index (BMI) of 30.0 to 30.9 in adult, unspecified obesity type    -  Primary      1) 1500 Calorie ADA Diet  2) Patient education given regarding appropriate lifestyle changes for weight loss including: regular physical activity, healthy coping strategies, caloric restriction and healthy eating patterns.  3) Patient to take medication, with the benefits of appetite suppression and metabolism boost d/w pt, along with the side effects and risk factors of long term use that will  be avoided with our use of short bursts of therapy. Rx provided.    4) Telephone 5:45mn  5)  Return in about 4 weeks (around 06/22/2020) for medication follow.    AMalachy Mood MD, [facility], [facility] 05/25/2020, 10:28 AM

## 2020-06-22 ENCOUNTER — Encounter: Payer: Self-pay | Admitting: Obstetrics and Gynecology

## 2020-06-22 ENCOUNTER — Other Ambulatory Visit: Payer: Self-pay

## 2020-06-22 ENCOUNTER — Ambulatory Visit (INDEPENDENT_AMBULATORY_CARE_PROVIDER_SITE_OTHER): Payer: Commercial Managed Care - PPO | Admitting: Obstetrics and Gynecology

## 2020-06-22 VITALS — Ht 67.0 in | Wt 189.0 lb

## 2020-06-22 DIAGNOSIS — Z6829 Body mass index (BMI) 29.0-29.9, adult: Secondary | ICD-10-CM | POA: Diagnosis not present

## 2020-06-22 DIAGNOSIS — E663 Overweight: Secondary | ICD-10-CM

## 2020-06-22 MED ORDER — PHENTERMINE HCL 37.5 MG PO TABS: 37.5000 mg | ORAL_TABLET | Freq: Every day | ORAL | 0 refills | Status: DC

## 2020-06-22 NOTE — Progress Notes (Signed)
I connected with Jillian Foster on 06/22/20 at  9:50 AM EDT by telephone and verified that I am speaking with the correct person using two identifiers.   I discussed the limitations, risks, security and privacy concerns of performing an evaluation and management service by telephone and the availability of in person appointments. I also discussed with the patient that there may be a patient responsible charge related to this service. The patient expressed understanding and agreed to proceed.  The patient was at home I spoke with the patient from my workstation phone The names of people involved in this encounter were: Jillian Foster , and Jillian Foster   Gynecology Office Visit  Chief Complaint:  Chief Complaint  Patient presents with  . Follow-up    History of Present Illness: Patientis a 49 y.o. G65P1011 female, who presents for the evaluation of the desire to lose weight. She has lost 0 pounds 1 months, 7 lbs 2 month weight loss. The patient states the following symptoms since starting her weight loss therapy: appetite suppression, energy, and weight loss.  The patient also reports no other ill effects. The patient specifically denies heart palpitations, anxiety, and insomnia.    Review of Systems: 10 point review of systems negative unless otherwise noted in HPI  Past Medical History:  Past Medical History:  Diagnosis Date  . 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 Anxiety   . Diabetes mellitus without complication ([place])   . Genital herpes   . Goiter   . High risk human papilloma cervical smear 2015  . History of abnormal cervical Pap smear    POsitive HRHPV  . Insomnia     Past Surgical History:  Past Surgical History:  Procedure Laterality Date  . BIOPSY THYROID  01/18/2014   B9 FNA  . BREAST BIOPSY  08/2010   Left benign   Fibroadenoma  . CESAREAN SECTION  05/2000   Failed ECV..Breech  . CHOLECYSTECTOMY    . COLPOSCOPY    . COMBINED  HYSTEROSCOPY DIAGNOSTIC / D&C    . INTRAUTERINE DEVICE (IUD) INSERTION N/A 10/10/2018   Procedure: INTRAUTERINE DEVICE (IUD) INSERTION;  Surgeon: Rubie Maid, MD;  Location: [facility] ORS;  Service: Gynecology;  Laterality: N/A;  . INTRAUTERINE DEVICE INSERTION  08/25/2013  . IUD REMOVAL N/A 10/10/2018   Procedure: INTRAUTERINE DEVICE (IUD) REMOVAL;  Surgeon: Rubie Maid, MD;  Location: [facility] ORS;  Service: Gynecology;  Laterality: N/A;  CERVICAL DILATION    Gynecologic History: No LMP recorded. (Menstrual status: IUD).  Obstetric History: G2P1011  Family History:  Family History  Problem Relation Age of Onset  . Diabetes Maternal Grandmother        Type 2  . Healthy Father   . Healthy Mother   . Cancer Neg Hx   . Hypertension Neg Hx   . Stroke Neg Hx   . Thyroid disease Neg Hx   . Breast cancer Neg Hx     Social History:  Social History   Socioeconomic History  . Marital status: Single    Spouse name: Not on file  . Number of children: 1  . Years of education: Not on file  . Highest education level: Not on file  Occupational History  . Not on file  Tobacco Use  . Smoking status: Never Smoker  . Smokeless tobacco: Never Used  Vaping Use  . Vaping Use: Never used  Substance and Sexual Activity  . Alcohol use: Yes  . Drug use: No  .  Sexual activity: Not Currently    Partners: Male    Birth control/protection: I.U.D.  Other Topics Concern  . Not on file  Social History Narrative  . Not on file   Social Determinants of Health   Financial Resource Strain:   . Difficulty of Paying Living Expenses: Not on file  Food Insecurity:   . Worried About Charity fundraiser in the Last Year: Not on file  . Ran Out of Food in the Last Year: Not on file  Transportation Needs:   . Lack of Transportation (Medical): Not on file  . Lack of Transportation (Non-Medical): Not on file  Physical Activity:   . Days of Exercise per Week: Not on file  . Minutes of Exercise per  Session: Not on file  Stress:   . Feeling of Stress : Not on file  Social Connections:   . Frequency of Communication with Friends and Family: Not on file  . Frequency of Social Gatherings with Friends and Family: Not on file  . Attends Religious Services: Not on file  . Active Member of Clubs or Organizations: Not on file  . Attends Archivist Meetings: Not on file  . Marital Status: Not on file  Intimate Partner Violence:   . Fear of Current or Ex-Partner: Not on file  . Emotionally Abused: Not on file  . Physically Abused: Not on file  . Sexually Abused: Not on file    Allergies:  No Known Allergies  Medications: Prior to Admission medications   Medication Sig Start Date End Date Taking? Authorizing Provider  Blood Glucose Monitoring Suppl (GLUCOCOM BLOOD GLUCOSE MONITOR) DEVI One Touch Verio meter. Use to check sugar once daily. 12/19/16  Yes [provider]  cyanocobalamin (,VITAMIN B-12,) 1000 MCG/ML injection Inject 1 mL (1,000 mcg total) into the muscle every 30 (thirty) days. 04/18/18  Yes Shambley, Melody N, CNM  glucose blood ([employer] TEST STRIPS) test strip Use once daily. 02/02/15  Yes [provider]  ibuprofen (ADVIL,MOTRIN) 800 MG tablet Take 1 tablet (800 mg total) by mouth every 8 (eight) hours as needed. 10/10/18  Yes Rubie Maid, MD  insulin glargine (LANTUS) 100 UNIT/ML injection Inject into the skin daily. 18 units nightly   Yes [provider]  phentermine (ADIPEX-P) 37.5 MG tablet Take 1 tablet (37.5 mg total) by mouth daily before breakfast. 05/25/20  Yes Jillian Mood, MD  pravastatin (PRAVACHOL) 20 MG tablet Take 20 mg by mouth daily.   Yes [provider]  Dulaglutide (TRULICITY) 2.63 FH/5.4TG SOPN Inject 0.75 mg into the skin every Tuesday.  Patient not taking: Reported on 06/22/2020    [provider]  [place], 1 MG/DOSE, 4 MG/3ML SOPN  06/10/20   [provider]    Physical Exam Height 5' 7"   (1.702 m), weight 189 lb (85.7 kg). Wt Readings from Last 3 Encounters:  06/22/20 189 lb (85.7 kg)  05/25/20 188 lb (85.3 kg)  04/27/20 196 lb 6.4 oz (89.1 kg)  Body mass index is 29.6 kg/m.  No physical exam as this was a remote telephone visit to promote social distancing during the current COVID-19 Pandemic  Assessment: 49 y.o. G2P1011 follow up medical weight loss  Plan: Problem List Items Addressed This Visit    None    Visit Diagnoses    Overweight (BMI 25.0-29.9)    -  Primary   BMI 29.0-29.9,adult          1) 1500 Calorie ADA Diet  2) Patient  education given regarding appropriate lifestyle changes for weight loss including: regular physical activity, healthy coping strategies, caloric restriction and healthy eating patterns.  3) Patient to take medication, with the benefits of appetite suppression and metabolism boost d/w pt, along with the side effects and risk factors of long term use that will be avoided with our use of short bursts of therapy. Rx provided.  - was changed from Trulicity to [place] by her PCP   4) Telephone time 5 minutes  5)  No follow-ups on file.    Jillian Mood, MD, [facility], [facility] 06/22/2020, 10:18 AM

## 2020-07-22 ENCOUNTER — Other Ambulatory Visit: Payer: Self-pay

## 2020-07-22 ENCOUNTER — Ambulatory Visit (INDEPENDENT_AMBULATORY_CARE_PROVIDER_SITE_OTHER): Payer: Commercial Managed Care - PPO | Admitting: Obstetrics and Gynecology

## 2020-07-22 ENCOUNTER — Encounter: Payer: Self-pay | Admitting: Obstetrics and Gynecology

## 2020-07-22 VITALS — Wt 182.0 lb

## 2020-07-22 DIAGNOSIS — Z6828 Body mass index (BMI) 28.0-28.9, adult: Secondary | ICD-10-CM

## 2020-07-22 DIAGNOSIS — Z131 Encounter for screening for diabetes mellitus: Secondary | ICD-10-CM | POA: Diagnosis not present

## 2020-07-22 DIAGNOSIS — E663 Overweight: Secondary | ICD-10-CM

## 2020-07-22 NOTE — Progress Notes (Signed)
I connected with Jillian Foster on 07/22/20 at 10:10 AM EDT by telephone and verified that I am speaking with the correct person using two identifiers.   I discussed the limitations, risks, security and privacy concerns of performing an evaluation and management service by telephone and the availability of in person appointments. I also discussed with the patient that there may be a patient responsible charge related to this service. The patient expressed understanding and agreed to proceed.  The patient was at home I spoke with the patient from my workstation phone The names of people involved in this encounter were: Jillian Foster , and [name]   Gynecology Office Visit  Chief Complaint:  Chief Complaint  Patient presents with  . Weight Check    History of Present Illness: Patientis a 49 y.o. G78P1011 female, who presents for the evaluation of the desire to lose weight. She has lost 7 pounds 1 months. The patient states the following symptoms since starting her weight loss therapy: appetite suppression, energy, and weight loss.  The patient also reports no other ill effects. The patient specifically denies heart palpitations, anxiety, and insomnia.   Review of Systems: 10 point review of systems negative unless otherwise noted in HPI  Past Medical History:  Past Medical History:  Diagnosis Date  . 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 Anxiety   . Diabetes mellitus without complication ([place])   . Genital herpes   . Goiter   . High risk human papilloma cervical smear 2015  . History of abnormal cervical Pap smear    POsitive HRHPV  . Insomnia     Past Surgical History:  Past Surgical History:  Procedure Laterality Date  . BIOPSY THYROID  01/18/2014   B9 FNA  . BREAST BIOPSY  08/2010   Left benign   Fibroadenoma  . CESAREAN SECTION  05/2000   Failed ECV..Breech  . CHOLECYSTECTOMY    . COLPOSCOPY    . COMBINED HYSTEROSCOPY DIAGNOSTIC / D&C      . INTRAUTERINE DEVICE (IUD) INSERTION N/A 10/10/2018   Procedure: INTRAUTERINE DEVICE (IUD) INSERTION;  Surgeon: Rubie Maid, MD;  Location: [facility];  Service: Gynecology;  Laterality: N/A;  . INTRAUTERINE DEVICE INSERTION  08/25/2013  . IUD REMOVAL N/A 10/10/2018   Procedure: INTRAUTERINE DEVICE (IUD) REMOVAL;  Surgeon: Rubie Maid, MD;  Location: [facility];  Service: Gynecology;  Laterality: N/A;  CERVICAL DILATION    Gynecologic History: No LMP recorded. (Menstrual status: IUD).  Obstetric History: G2P1011  Family History:  Family History  Problem Relation Age of Onset  . Diabetes Maternal Grandmother        Type 2  . Healthy Father   . Healthy Mother   . Cancer Neg Hx   . Hypertension Neg Hx   . Stroke Neg Hx   . Thyroid disease Neg Hx   . Breast cancer Neg Hx     Social History:  Social History   Socioeconomic History  . Marital status: Single    Spouse name: Not on file  . Number of children: 1  . Years of education: Not on file  . Highest education level: Not on file  Occupational History  . Not on file  Tobacco Use  . Smoking status: Never Smoker  . Smokeless tobacco: Never Used  Vaping Use  . Vaping Use: Never used  Substance and Sexual Activity  . Alcohol use: Yes  . Drug use: No  . Sexual activity: Not Currently  Partners: Male    Birth control/protection: I.U.D.  Other Topics Concern  . Not on file  Social History Narrative  . Not on file   Social Determinants of Health   Financial Resource Strain:   . Difficulty of Paying Living Expenses: Not on file  Food Insecurity:   . Worried About Charity fundraiser in the Last Year: Not on file  . Ran Out of Food in the Last Year: Not on file  Transportation Needs:   . Lack of Transportation (Medical): Not on file  . Lack of Transportation (Non-Medical): Not on file  Physical Activity:   . Days of Exercise per Week: Not on file  . Minutes of Exercise per Session: Not on file  Stress:   .  Feeling of Stress : Not on file  Social Connections:   . Frequency of Communication with Friends and Family: Not on file  . Frequency of Social Gatherings with Friends and Family: Not on file  . Attends Religious Services: Not on file  . Active Member of Clubs or Organizations: Not on file  . Attends Archivist Meetings: Not on file  . Marital Status: Not on file  Intimate Partner Violence:   . Fear of Current or Ex-Partner: Not on file  . Emotionally Abused: Not on file  . Physically Abused: Not on file  . Sexually Abused: Not on file    Allergies:  No Known Allergies  Medications: Prior to Admission medications   Medication Sig Start Date End Date Taking? Authorizing Provider  Blood Glucose Monitoring Suppl (GLUCOCOM BLOOD GLUCOSE MONITOR) DEVI One Touch Verio meter. Use to check sugar once daily. 12/19/16  Yes [provider]  glucose blood ([employer] TEST STRIPS) test strip Use once daily. 02/02/15  Yes [provider]  ibuprofen (ADVIL,MOTRIN) 800 MG tablet Take 1 tablet (800 mg total) by mouth every 8 (eight) hours as needed. 10/10/18  Yes Rubie Maid, MD  insulin glargine (LANTUS) 100 UNIT/ML injection Inject into the skin daily. 18 units nightly   Yes [provider]  [place], 1 MG/DOSE, 4 MG/3ML SOPN  06/10/20  Yes [provider]  phentermine (ADIPEX-P) 37.5 MG tablet Take 1 tablet (37.5 mg total) by mouth daily before breakfast. 06/22/20  Yes [name], MD  pravastatin (PRAVACHOL) 20 MG tablet Take 20 mg by mouth daily.   Yes [provider]    Physical Exam Weight 182 lb (82.6 kg). Wt Readings from Last 3 Encounters:  07/22/20 182 lb (82.6 kg)  06/22/20 189 lb (85.7 kg)  05/25/20 188 lb (85.3 kg)  Body mass index is 28.51 kg/m.  No physical exam as this was a remote telephone visit to promote social distancing during the current COVID-19 Pandemic  Assessment: 49 y.o. [id number] No problem-specific Assessment &  Plan notes found for this encounter.   Plan: Problem List Items Addressed This Visit    None    Visit Diagnoses    Overweight (BMI 25.0-29.9)    -  Primary   Overweight with body mass index (BMI) of 28 to 28.9 in adult       Diabetes mellitus screening          1) 1500 Calorie ADA Diet  2) Patient education given regarding appropriate lifestyle changes for weight loss including: regular physical activity, healthy coping strategies, caloric restriction and healthy eating patterns.  3) Patient to take medication, with the benefits of appetite suppression and metabolism boost d/w pt, along with the side  effects and risk factors of long term use that will be avoided with our use of short bursts of therapy. Rx provided.    4) Telephone time 5:15 min  5)  Return in about 4 weeks (around 08/19/2020) for medication follow up.    [name], MD, [facility], [facility] 07/22/2020, 10:41 AM

## 2020-08-03 ENCOUNTER — Other Ambulatory Visit: Payer: Self-pay | Admitting: Obstetrics and Gynecology

## 2020-08-04 ENCOUNTER — Other Ambulatory Visit: Payer: Self-pay

## 2020-08-04 MED ORDER — PHENTERMINE HCL 37.5 MG PO TABS: 37.5000 mg | ORAL_TABLET | Freq: Every day | ORAL | 0 refills | Status: DC

## 2020-08-22 ENCOUNTER — Ambulatory Visit: Payer: Commercial Managed Care - PPO | Admitting: Obstetrics and Gynecology

## 2020-08-25 NOTE — Telephone Encounter (Signed)
Patient is scheduled for 09/06/20 with AMS for phone visit

## 2020-09-06 ENCOUNTER — Other Ambulatory Visit: Payer: Self-pay

## 2020-09-06 ENCOUNTER — Encounter: Payer: Self-pay | Admitting: Obstetrics and Gynecology

## 2020-09-06 ENCOUNTER — Ambulatory Visit (INDEPENDENT_AMBULATORY_CARE_PROVIDER_SITE_OTHER): Payer: Commercial Managed Care - PPO | Admitting: Obstetrics and Gynecology

## 2020-09-06 VITALS — Wt 178.0 lb

## 2020-09-06 DIAGNOSIS — Z6827 Body mass index (BMI) 27.0-27.9, adult: Secondary | ICD-10-CM | POA: Diagnosis not present

## 2020-09-06 DIAGNOSIS — E119 Type 2 diabetes mellitus without complications: Secondary | ICD-10-CM

## 2020-09-06 DIAGNOSIS — E663 Overweight: Secondary | ICD-10-CM | POA: Diagnosis not present

## 2020-09-06 MED ORDER — PHENTERMINE HCL 37.5 MG PO TABS
37.5000 mg | ORAL_TABLET | Freq: Every day | ORAL | 0 refills | Status: DC
Start: 2020-09-06 — End: 2020-10-11

## 2020-09-06 NOTE — Progress Notes (Signed)
I connected with Jillian Foster on 09/06/20 at 10:10 AM EST by telephone and verified that I am speaking with the correct person using two identifiers.   I discussed the limitations, risks, security and privacy concerns of performing an evaluation and management service by telephone and the availability of in person appointments. I also discussed with the patient that there may be a patient responsible charge related to this service. The patient expressed understanding and agreed to proceed.  The patient was at home I spoke with the patient from my workstation phone The names of people involved in this encounter were: Jillian Foster , and Jillian Foster   Gynecology Office Visit  Chief Complaint:  Chief Complaint  Patient presents with  . Follow-up    Medication F/U, no concerns. Phone visit.    History of Present Illness: Patientis a 49 y.o. G28P1011 female, who presents for the evaluation of the desire to lose weight. She has lost 4 pounds 1 months. The patient states the following symptoms since starting her weight loss therapy: appetite suppression, energy, and weight loss.  The patient also reports no other ill effects. The patient specifically denies heart palpitations, anxiety, and insomnia.    Review of Systems: 10 point review of systems negative unless otherwise noted in HPI  Past Medical History:  Past Medical History:  Diagnosis Date  . 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 Anxiety   . Diabetes mellitus without complication ([place])   . Genital herpes   . Goiter   . High risk human papilloma cervical smear 2015  . History of abnormal cervical Pap smear    POsitive HRHPV  . Insomnia     Past Surgical History:  Past Surgical History:  Procedure Laterality Date  . BIOPSY THYROID  01/18/2014   B9 FNA  . BREAST BIOPSY  08/2010   Left benign   Fibroadenoma  . CESAREAN SECTION  05/2000   Failed ECV..Breech  . CHOLECYSTECTOMY    . COLPOSCOPY      . COMBINED HYSTEROSCOPY DIAGNOSTIC / D&C    . INTRAUTERINE DEVICE (IUD) INSERTION N/A 10/10/2018   Procedure: INTRAUTERINE DEVICE (IUD) INSERTION;  Surgeon: Rubie Maid, MD;  Location: [facility];  Service: Gynecology;  Laterality: N/A;  . INTRAUTERINE DEVICE INSERTION  08/25/2013  . IUD REMOVAL N/A 10/10/2018   Procedure: INTRAUTERINE DEVICE (IUD) REMOVAL;  Surgeon: Rubie Maid, MD;  Location: [facility];  Service: Gynecology;  Laterality: N/A;  CERVICAL DILATION    Gynecologic History: No LMP recorded. (Menstrual status: IUD).  Obstetric History: G2P1011  Family History:  Family History  Problem Relation Age of Onset  . Diabetes Maternal Grandmother        Type 2  . Healthy Father   . Healthy Mother   . Cancer Neg Hx   . Hypertension Neg Hx   . Stroke Neg Hx   . Thyroid disease Neg Hx   . Breast cancer Neg Hx     Social History:  Social History   Socioeconomic History  . Marital status: Single    Spouse name: Not on file  . Number of children: 1  . Years of education: Not on file  . Highest education level: Not on file  Occupational History  . Not on file  Tobacco Use  . Smoking status: Never Smoker  . Smokeless tobacco: Never Used  Vaping Use  . Vaping Use: Never used  Substance and Sexual Activity  . Alcohol use: Yes  .  Drug use: No  . Sexual activity: Not Currently    Partners: Male    Birth control/protection: I.U.D.  Other Topics Concern  . Not on file  Social History Narrative  . Not on file   Social Determinants of Health   Financial Resource Strain:   . Difficulty of Paying Living Expenses: Not on file  Food Insecurity:   . Worried About Charity fundraiser in the Last Year: Not on file  . Ran Out of Food in the Last Year: Not on file  Transportation Needs:   . Lack of Transportation (Medical): Not on file  . Lack of Transportation (Non-Medical): Not on file  Physical Activity:   . Days of Exercise per Week: Not on file  . Minutes of  Exercise per Session: Not on file  Stress:   . Feeling of Stress : Not on file  Social Connections:   . Frequency of Communication with Friends and Family: Not on file  . Frequency of Social Gatherings with Friends and Family: Not on file  . Attends Religious Services: Not on file  . Active Member of Clubs or Organizations: Not on file  . Attends Archivist Meetings: Not on file  . Marital Status: Not on file  Intimate Partner Violence:   . Fear of Current or Ex-Partner: Not on file  . Emotionally Abused: Not on file  . Physically Abused: Not on file  . Sexually Abused: Not on file    Allergies:  No Known Allergies  Medications: Prior to Admission medications   Medication Sig Start Date End Date Taking? Authorizing Provider  Blood Glucose Monitoring Suppl (GLUCOCOM BLOOD GLUCOSE MONITOR) DEVI One Touch Verio meter. Use to check sugar once daily. 12/19/16  Yes [provider]  glucose blood ([employer] TEST STRIPS) test strip Use once daily. 02/02/15  Yes [provider]  ibuprofen (ADVIL,MOTRIN) 800 MG tablet Take 1 tablet (800 mg total) by mouth every 8 (eight) hours as needed. 10/10/18  Yes Rubie Maid, MD  insulin glargine (LANTUS) 100 UNIT/ML injection Inject into the skin daily. 18 units nightly   Yes [provider]  [place], 1 MG/DOSE, 4 MG/3ML SOPN  06/10/20  Yes [provider]  phentermine (ADIPEX-P) 37.5 MG tablet Take 1 tablet (37.5 mg total) by mouth daily before breakfast. 08/04/20  Yes Jillian Mood, MD  pravastatin (PRAVACHOL) 20 MG tablet Take 20 mg by mouth daily.   Yes [provider]    Physical Exam There were no vitals taken for this visit. Wt Readings from Last 3 Encounters:  09/06/20 178 lb (80.7 kg)  07/22/20 182 lb (82.6 kg)  06/22/20 189 lb (85.7 kg)  Body mass index is 27.88 kg/m.  No physical exam as this was a remote telephone visit to promote social distancing during the current COVID-19  Pandemic   Assessment: 49 y.o. [id number] medical weight loss follow up  Plan: Problem List Items Addressed This Visit      Endocrine   Diabetes mellitus without complication ([place])    Other Visit Diagnoses    Overweight (BMI 25.0-29.9)    -  Primary   BMI 27.0-27.9,adult          1) 1500 Calorie ADA Diet  2) Patient education given regarding appropriate lifestyle changes for weight loss including: regular physical activity, healthy coping strategies, caloric restriction and healthy eating patterns.  3) Patient will be started on weight loss medication. The risks and benefits and side effects of medication, such  as Adipex (Phenteramine) ,  Tenuate (Diethylproprion), Belviq (lorcarsin), Contrave (buproprion/naltrexone), Qsymia (phentermine/topiramate), and Saxenda (liraglutide) is discussed. The pros and cons of suppressing appetite and boosting metabolism is discussed. Risks of tolerence and addiction is discussed for selected agents discussed. Use of medicine will ne short term, such as 3-4 months at a time followed by a period of time off of the medicine to avoid these risks and side effects for Adipex, Qsymia, and Tenuate discussed. Pt to call with any negative side effects and agrees to keep follow up appts.  4) Patient to take medication, with the benefits of appetite suppression and metabolism boost d/w pt, along with the side effects and risk factors of long term use that will be avoided with our use of short bursts of therapy. Rx provided.    5) Telephone time 7 minutes  6)  Return in about 4 weeks (around 10/04/2020) for medication.    Jillian Mood, MD, [facility], [facility] 09/06/2020, 10:49 AM

## 2020-09-06 NOTE — Progress Notes (Signed)
Medication F/U, no concerns. Phone visit.

## 2020-10-11 ENCOUNTER — Encounter: Payer: Self-pay | Admitting: Obstetrics and Gynecology

## 2020-10-11 ENCOUNTER — Other Ambulatory Visit: Payer: Self-pay

## 2020-10-11 ENCOUNTER — Ambulatory Visit (INDEPENDENT_AMBULATORY_CARE_PROVIDER_SITE_OTHER): Payer: Commercial Managed Care - PPO | Admitting: Obstetrics and Gynecology

## 2020-10-11 VITALS — Wt 181.0 lb

## 2020-10-11 DIAGNOSIS — Z6828 Body mass index (BMI) 28.0-28.9, adult: Secondary | ICD-10-CM

## 2020-10-11 DIAGNOSIS — E663 Overweight: Secondary | ICD-10-CM

## 2020-10-11 MED ORDER — PHENTERMINE HCL 37.5 MG PO TABS
37.5000 mg | ORAL_TABLET | Freq: Every day | ORAL | 0 refills | Status: DC
Start: 2020-10-11 — End: 2021-03-01

## 2020-10-11 NOTE — Progress Notes (Signed)
I connected with Jillian Foster on 10/11/20 at 10:50 AM EST by telephone and verified that I am speaking with the correct person using two identifiers.   I discussed the limitations, risks, security and privacy concerns of performing an evaluation and management service by telephone and the availability of in person appointments. I also discussed with the patient that there may be a patient responsible charge related to this service. The patient expressed understanding and agreed to proceed.  The patient was at home I spoke with the patient from my workstation phone The names of people involved in this encounter were: Jillian Foster , and Jillian Foster   Gynecology Office Visit  Chief Complaint:  Chief Complaint  Patient presents with  . Follow-up    Phone visit    History of Present Illness: Patientis a 49 y.o. G42P1011 female, who presents for the evaluation of the desire to lose weight. She has gained 3 pounds 1 months. The patient states the following symptoms since starting her weight loss therapy: appetite suppression, energy, and weight loss.  The patient also reports no other ill effects. The patient specifically denies heart palpitations, anxiety, and insomnia.    Review of Systems: 10 point review of systems negative unless otherwise noted in HPI  Past Medical History:  Past Medical History:  Diagnosis Date  . 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 Anxiety   . Diabetes mellitus without complication ([place])   . Genital herpes   . Goiter   . High risk human papilloma cervical smear 2015  . History of abnormal cervical Pap smear    POsitive HRHPV  . Insomnia     Past Surgical History:  Past Surgical History:  Procedure Laterality Date  . BIOPSY THYROID  01/18/2014   B9 FNA  . BREAST BIOPSY  08/2010   Left benign   Fibroadenoma  . CESAREAN SECTION  05/2000   Failed ECV..Breech  . CHOLECYSTECTOMY    . COLPOSCOPY    . COMBINED HYSTEROSCOPY  DIAGNOSTIC / D&C    . INTRAUTERINE DEVICE (IUD) INSERTION N/A 10/10/2018   Procedure: INTRAUTERINE DEVICE (IUD) INSERTION;  Surgeon: Rubie Maid, MD;  Location: [facility];  Service: Gynecology;  Laterality: N/A;  . INTRAUTERINE DEVICE INSERTION  08/25/2013  . IUD REMOVAL N/A 10/10/2018   Procedure: INTRAUTERINE DEVICE (IUD) REMOVAL;  Surgeon: Rubie Maid, MD;  Location: [facility];  Service: Gynecology;  Laterality: N/A;  CERVICAL DILATION    Gynecologic History: No LMP recorded. (Menstrual status: IUD).  Obstetric History: G2P1011  Family History:  Family History  Problem Relation Age of Onset  . Diabetes Maternal Grandmother        Type 2  . Healthy Father   . Healthy Mother   . Cancer Neg Hx   . Hypertension Neg Hx   . Stroke Neg Hx   . Thyroid disease Neg Hx   . Breast cancer Neg Hx     Social History:  Social History   Socioeconomic History  . Marital status: Single    Spouse name: Not on file  . Number of children: 1  . Years of education: Not on file  . Highest education level: Not on file  Occupational History  . Not on file  Tobacco Use  . Smoking status: Never Smoker  . Smokeless tobacco: Never Used  Vaping Use  . Vaping Use: Never used  Substance and Sexual Activity  . Alcohol use: Yes  . Drug use: No  .  Sexual activity: Not Currently    Partners: Male    Birth control/protection: I.U.D.  Other Topics Concern  . Not on file  Social History Narrative  . Not on file   Social Determinants of Health   Financial Resource Strain: Not on file  Food Insecurity: Not on file  Transportation Needs: Not on file  Physical Activity: Not on file  Stress: Not on file  Social Connections: Not on file  Intimate Partner Violence: Not on file    Allergies:  No Known Allergies  Medications: Prior to Admission medications   Medication Sig Start Date End Date Taking? Authorizing Provider  Blood Glucose Monitoring Suppl (GLUCOCOM BLOOD GLUCOSE MONITOR) DEVI  One Touch Verio meter. Use to check sugar once daily. 12/19/16  Yes [provider]  glucose blood test strip Use once daily. 02/02/15  Yes [provider]  ibuprofen (ADVIL,MOTRIN) 800 MG tablet Take 1 tablet (800 mg total) by mouth every 8 (eight) hours as needed. 10/10/18  Yes Rubie Maid, MD  insulin glargine (LANTUS) 100 UNIT/ML injection Inject into the skin daily. 18 units nightly   Yes [provider]  [place], 1 MG/DOSE, 4 MG/3ML SOPN  06/10/20  Yes [provider]  phentermine (ADIPEX-P) 37.5 MG tablet Take 1 tablet (37.5 mg total) by mouth daily before breakfast. 09/06/20  Yes Jillian Mood, MD  pravastatin (PRAVACHOL) 20 MG tablet Take 20 mg by mouth daily.   Yes [provider]    Physical Exam Weight 181 lb (82.1 kg). Wt Readings from Last 3 Encounters:  10/11/20 181 lb (82.1 kg)  09/06/20 178 lb (80.7 kg)  07/22/20 182 lb (82.6 kg)  Body mass index is 28.35 kg/m.  No physical exam as this was a remote telephone visit to promote social distancing during the current COVID-19 Pandemic   Assessment: 49 y.o. G2P1011 follow medical weight loss  Plan: Problem List Items Addressed This Visit   None   Visit Diagnoses    Overweight (BMI 25.0-29.9)    -  Primary   BMI 28.0-28.9,adult          1) 1500 Calorie ADA Diet  2) Patient education given regarding appropriate lifestyle changes for weight loss including: regular physical activity, healthy coping strategies, caloric restriction and healthy eating patterns  3) Patient to take medication, with the benefits of appetite suppression and metabolism boost d/w pt, along with the side effects and risk factors of long term use that will be avoided with our use of short bursts of therapy. Rx provided.    4) Reports good improvement in HgbA1C with last check by endocrinology  5) Telephone Time 5 minutes  6)  Return in about 4 weeks (around 11/08/2020) for medication follow up  .    Jillian Mood, MD, [facility], [facility] 10/11/2020, 10:04 AM

## 2020-11-11 ENCOUNTER — Ambulatory Visit (INDEPENDENT_AMBULATORY_CARE_PROVIDER_SITE_OTHER): Payer: Commercial Managed Care - PPO | Admitting: Obstetrics and Gynecology

## 2020-11-11 ENCOUNTER — Other Ambulatory Visit: Payer: Self-pay

## 2020-11-11 ENCOUNTER — Encounter: Payer: Self-pay | Admitting: Obstetrics and Gynecology

## 2020-11-11 VITALS — Wt 182.0 lb

## 2020-11-11 DIAGNOSIS — E663 Overweight: Secondary | ICD-10-CM | POA: Diagnosis not present

## 2020-11-11 DIAGNOSIS — Z6828 Body mass index (BMI) 28.0-28.9, adult: Secondary | ICD-10-CM

## 2020-11-11 DIAGNOSIS — E119 Type 2 diabetes mellitus without complications: Secondary | ICD-10-CM | POA: Diagnosis not present

## 2020-11-11 NOTE — Progress Notes (Signed)
I connected with Jillian Foster on 11/11/20 at  8:50 AM EST by telephone and verified that I am speaking with the correct person using two identifiers.   I discussed the limitations, risks, security and privacy concerns of performing an evaluation and management service by telephone and the availability of in person appointments. I also discussed with the patient that there may be a patient responsible charge related to this service. The patient expressed understanding and agreed to proceed.  The patient was at home I spoke with the patient from my workstation phone The names of people involved in this encounter were: Jillian Foster , and Jillian Foster   Gynecology Office Visit  Chief Complaint:  Chief Complaint  Patient presents with  . Follow-up    Phone visit - F/U medication    History of Present Illness: Patientis a 50 y.o. G33P1011 female, who presents for the evaluation of the desire to lose weight. She has gained 1lbs in the past month. The patient states the following symptoms since starting her weight loss therapy: appetite suppression, energy, and weight loss.  The patient also reports no other ill effects. The patient specifically denies heart palpitations, anxiety, and insomnia.    Review of Systems: 10 point review of systems negative unless otherwise noted in HPI  Past Medical History:  Past Medical History:  Diagnosis Date  . 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 Anxiety   . Diabetes mellitus without complication ([place])   . Genital herpes   . Goiter   . High risk human papilloma cervical smear 2015  . History of abnormal cervical Pap smear    POsitive HRHPV  . Insomnia     Past Surgical History:  Past Surgical History:  Procedure Laterality Date  . BIOPSY THYROID  01/18/2014   B9 FNA  . BREAST BIOPSY  08/2010   Left benign   Fibroadenoma  . CESAREAN SECTION  05/2000   Failed ECV..Breech  . CHOLECYSTECTOMY    . COLPOSCOPY    .  COMBINED HYSTEROSCOPY DIAGNOSTIC / D&C    . INTRAUTERINE DEVICE (IUD) INSERTION N/A 10/10/2018   Procedure: INTRAUTERINE DEVICE (IUD) INSERTION;  Surgeon: Rubie Maid, MD;  Location: [facility];  Service: Gynecology;  Laterality: N/A;  . INTRAUTERINE DEVICE INSERTION  08/25/2013  . IUD REMOVAL N/A 10/10/2018   Procedure: INTRAUTERINE DEVICE (IUD) REMOVAL;  Surgeon: Rubie Maid, MD;  Location: [facility];  Service: Gynecology;  Laterality: N/A;  CERVICAL DILATION    Gynecologic History: No LMP recorded. (Menstrual status: IUD).  Obstetric History: G2P1011  Family History:  Family History  Problem Relation Age of Onset  . Diabetes Maternal Grandmother        Type 2  . Healthy Father   . Healthy Mother   . Cancer Neg Hx   . Hypertension Neg Hx   . Stroke Neg Hx   . Thyroid disease Neg Hx   . Breast cancer Neg Hx     Social History:  Social History   Socioeconomic History  . Marital status: Single    Spouse name: Not on file  . Number of children: 1  . Years of education: Not on file  . Highest education level: Not on file  Occupational History  . Not on file  Tobacco Use  . Smoking status: Never Smoker  . Smokeless tobacco: Never Used  Vaping Use  . Vaping Use: Never used  Substance and Sexual Activity  . Alcohol use: Yes  .  Drug use: No  . Sexual activity: Not Currently    Partners: Male    Birth control/protection: I.U.D.  Other Topics Concern  . Not on file  Social History Narrative  . Not on file   Social Determinants of Health   Financial Resource Strain: Not on file  Food Insecurity: Not on file  Transportation Needs: Not on file  Physical Activity: Not on file  Stress: Not on file  Social Connections: Not on file  Intimate Partner Violence: Not on file    Allergies:  No Known Allergies  Medications: Prior to Admission medications   Medication Sig Start Date End Date Taking? Authorizing Provider  Blood Glucose Monitoring Suppl (GLUCOCOM BLOOD  GLUCOSE MONITOR) DEVI One Touch Verio meter. Use to check sugar once daily. 12/19/16  Yes [provider]  glucose blood test strip Use once daily. 02/02/15  Yes [provider]  ibuprofen (ADVIL,MOTRIN) 800 MG tablet Take 1 tablet (800 mg total) by mouth every 8 (eight) hours as needed. 10/10/18  Yes Rubie Maid, MD  insulin glargine (LANTUS) 100 UNIT/ML injection Inject into the skin daily. 18 units nightly   Yes [provider]  [place], 1 MG/DOSE, 4 MG/3ML SOPN  06/10/20  Yes [provider]  phentermine (ADIPEX-P) 37.5 MG tablet Take 1 tablet (37.5 mg total) by mouth daily before breakfast. 10/11/20  Yes Jillian Mood, MD  pravastatin (PRAVACHOL) 20 MG tablet Take 20 mg by mouth daily.   Yes [provider]    Physical Exam Weight 182 lb (82.6 kg). Wt Readings from Last 3 Encounters:  11/11/20 182 lb (82.6 kg)  10/11/20 181 lb (82.1 kg)  09/06/20 178 lb (80.7 kg)  Body mass index is 28.51 kg/m.  No physical exam as this was a remote telephone visit to promote social distancing during the current COVID-19 Pandemic  Assessment: 50 y.o. G2P1011   Plan: Problem List Items Addressed This Visit      Endocrine   Diabetes mellitus without complication ([place])    Other Visit Diagnoses    Overweight (BMI 25.0-29.9)    -  Primary   BMI 28.0-28.9,adult          1) 1500 Calorie ADA Diet  2) Patient education given regarding appropriate lifestyle changes for weight loss including: regular physical activity, healthy coping strategies, caloric restriction and healthy eating patterns.  3) Patient will be started on weight loss medication. The risks and benefits and side effects of medication, such as Adipex (Phenteramine) ,  Tenuate (Diethylproprion), Contrave (buproprion/naltrexone), Qsymia (phentermine/topiramate), and Saxenda (liraglutide) is discussed. The pros and cons of suppressing appetite and boosting metabolism is discussed. Risks of  tolerence and addiction is discussed for selected agents discussed. Use of medicine will ne short term, such as 3-4 months at a time followed by a period of time off of the medicine to avoid these risks and side effects for Adipex, Qsymia, and Tenuate discussed. Pt to call with any negative side effects and agrees to keep follow up appts.  4) 3 month drug free holiday   5) Telephone time 5 minutes  6)  Return in about 3 months (around 02/09/2021) for medication follow up.    Jillian Mood, MD, [facility], [facility] 11/11/2020, 9:30 AM

## 2020-12-20 ENCOUNTER — Encounter: Payer: Self-pay | Admitting: Obstetrics and Gynecology

## 2020-12-20 ENCOUNTER — Other Ambulatory Visit (HOSPITAL_COMMUNITY)
Admission: RE | Admit: 2020-12-20 | Discharge: 2020-12-20 | Disposition: A | Discharge status: Home / Self Care | Payer: Commercial Managed Care - PPO | Source: Ambulatory Visit | Attending: Obstetrics and Gynecology | Admitting: Obstetrics and Gynecology

## 2020-12-20 ENCOUNTER — Other Ambulatory Visit: Payer: Self-pay

## 2020-12-20 ENCOUNTER — Ambulatory Visit (INDEPENDENT_AMBULATORY_CARE_PROVIDER_SITE_OTHER): Payer: Commercial Managed Care - PPO | Admitting: Obstetrics and Gynecology

## 2020-12-20 VITALS — BP 137/84 | HR 96 | Ht 67.0 in | Wt 181.5 lb

## 2020-12-20 DIAGNOSIS — Z01419 Encounter for gynecological examination (general) (routine) without abnormal findings: Secondary | ICD-10-CM | POA: Diagnosis not present

## 2020-12-20 DIAGNOSIS — R61 Generalized hyperhidrosis: Secondary | ICD-10-CM

## 2020-12-20 DIAGNOSIS — Z1231 Encounter for screening mammogram for malignant neoplasm of breast: Secondary | ICD-10-CM | POA: Diagnosis not present

## 2020-12-20 DIAGNOSIS — Z124 Encounter for screening for malignant neoplasm of cervix: Secondary | ICD-10-CM | POA: Insufficient documentation

## 2020-12-20 DIAGNOSIS — E119 Type 2 diabetes mellitus without complications: Secondary | ICD-10-CM

## 2020-12-20 NOTE — Patient Instructions (Addendum)
Preventive Care 84-49 Years Old, Female Preventive care refers to lifestyle choices and visits with your health care provider that can promote health and wellness. This includes:  A yearly physical exam. This is also called an annual wellness visit.  Regular dental and eye exams.  Immunizations.  Screening for certain conditions.  Healthy lifestyle choices, such as: ? Eating a healthy diet. ? Getting regular exercise. ? Not using drugs or products that contain nicotine and tobacco. ? Limiting alcohol use. What can I expect for my preventive care visit? Physical exam Your health care provider will check your:  Height and weight. These may be used to calculate your BMI (body mass index). BMI is a measurement that tells if you are at a healthy weight.  Heart rate and blood pressure.  Body temperature.  Skin for abnormal spots. Counseling Your health care provider may ask you questions about your:  Past medical problems.  Family's medical history.  Alcohol, tobacco, and drug use.  Emotional well-being.  Home life and relationship well-being.  Sexual activity.  Diet, exercise, and sleep habits.  Work and work Statistician.  Access to firearms.  Method of birth control.  Menstrual cycle.  Pregnancy history. What immunizations do I need? Vaccines are usually given at various ages, according to a schedule. Your health care provider will recommend vaccines for you based on your age, medical history, and lifestyle or other factors, such as travel or where you work.   What tests do I need? Blood tests  Lipid and cholesterol levels. These may be checked every 5 years, or more often if you are over 3 years old.  Hepatitis C test.  Hepatitis B test. Screening  Lung cancer screening. You may have this screening every year starting at age 73 if you have a 30-pack-year history of smoking and currently smoke or have quit within the past 15 years.  Colorectal cancer  screening. ? All adults should have this screening starting at age 52 and continuing until age 17. ? Your health care provider may recommend screening at age 49 if you are at increased risk. ? You will have tests every 1-10 years, depending on your results and the type of screening test.  Diabetes screening. ? This is done by checking your blood sugar (glucose) after you have not eaten for a while (fasting). ? You may have this done every 1-3 years.  Mammogram. ? This may be done every 1-2 years. ? Talk with your health care provider about when you should start having regular mammograms. This may depend on whether you have a family history of breast cancer.  BRCA-related cancer screening. This may be done if you have a family history of breast, ovarian, tubal, or peritoneal cancers.  Pelvic exam and Pap test. ? This may be done every 3 years starting at age 10. ? Starting at age 11, this may be done every 5 years if you have a Pap test in combination with an HPV test. Other tests  STD (sexually transmitted disease) testing, if you are at risk.  Bone density scan. This is done to screen for osteoporosis. You may have this scan if you are at high risk for osteoporosis. Talk with your health care provider about your test results, treatment options, and if necessary, the need for more tests. Follow these instructions at home: Eating and drinking  Eat a diet that includes fresh fruits and vegetables, whole grains, lean protein, and low-fat dairy products.  Take vitamin and mineral supplements  as recommended by your health care provider.  Do not drink alcohol if: ? Your health care provider tells you not to drink. ? You are pregnant, may be pregnant, or are planning to become pregnant.  If you drink alcohol: ? Limit how much you have to 0-1 drink a day. ? Be aware of how much alcohol is in your drink. In the [place] one drink equals one 12 oz bottle of beer (355 mL), one 5 oz glass of  wine (148 mL), or one 1 oz glass of hard liquor (44 mL).   Lifestyle  Take daily care of your teeth and gums. Brush your teeth every morning and night with fluoride toothpaste. Floss one time each day.  Stay active. Exercise for at least 30 minutes 5 or more days each week.  Do not use any products that contain nicotine or tobacco, such as cigarettes, e-cigarettes, and chewing tobacco. If you need help quitting, ask your health care provider.  Do not use drugs.  If you are sexually active, practice safe sex. Use a condom or other form of protection to prevent STIs (sexually transmitted infections).  If you do not wish to become pregnant, use a form of birth control. If you plan to become pregnant, see your health care provider for a prepregnancy visit.  If told by your health care provider, take low-dose aspirin daily starting at age 75.  Find healthy ways to cope with stress, such as: ? Meditation, yoga, or listening to music. ? Journaling. ? Talking to a trusted person. ? Spending time with friends and family. Safety  Always wear your seat belt while driving or riding in a vehicle.  Do not drive: ? If you have been drinking alcohol. Do not ride with someone who has been drinking. ? When you are tired or distracted. ? While texting.  Wear a helmet and other protective equipment during sports activities.  If you have firearms in your house, make sure you follow all gun safety procedures. What's next?  Visit your health care provider once a year for an annual wellness visit.  Ask your health care provider how often you should have your eyes and teeth checked.  Stay up to date on all vaccines. This information is not intended to replace advice given to you by your health care provider. Make sure you discuss any questions you have with your health care provider. Document Revised: 07/05/2020 Document Reviewed: 06/12/2018 Elsevier Patient Education  2021 [place] Breast self-awareness is knowing how your breasts look and feel. Doing breast self-awareness is important. It allows you to catch a breast problem early while it is still small and can be treated. All women should do breast self-awareness, including women who have had breast implants. Tell your doctor if you notice a change in your breasts. What you need:  A mirror.  A well-lit room. How to do a breast self-exam A breast self-exam is one way to learn what is normal for your breasts and to check for changes. To do a breast self-exam: Look for changes 1. Take off all the clothes above your waist. 2. Stand in front of a mirror in a room with good lighting. 3. Put your hands on your hips. 4. Push your hands down. 5. Look at your breasts and nipples in the mirror to see if one breast or nipple looks different from the other. Check to see if: ? The shape of one breast is different. ? The size of  one breast is different. ? There are wrinkles, dips, and bumps in one breast and not the other. 6. Look at each breast for changes in the skin, such as: ? Redness. ? Scaly areas. 7. Look for changes in your nipples, such as: ? Liquid around the nipples. ? Bleeding. ? Dimpling. ? Redness. ? A change in where the nipples are.   Feel for changes 1. Lie on your back on the floor. 2. Feel each breast. To do this, follow these steps: ? Pick a breast to feel. ? Put the arm closest to that breast above your head. ? Use your other arm to feel the nipple area of your breast. Feel the area with the pads of your three middle fingers by making small circles with your fingers. For the first circle, press lightly. For the second circle, press harder. For the third circle, press even harder. ? Keep making circles with your fingers at the different pressures as you move down your breast. Stop when you feel your ribs. ? Move your fingers a little toward the center of your body. ? Start making  circles with your fingers again, this time going up until you reach your collarbone. ? Keep making up-and-down circles until you reach your armpit. Remember to keep using the three pressures. ? Feel the other breast in the same way. 3. Sit or stand in the tub or shower. 4. With soapy water on your skin, feel each breast the same way you did in step 2 when you were lying on the floor.   Write down what you find Writing down what you find can help you remember what to tell your doctor. Write down:  What is normal for each breast.  Any changes you find in each breast, including: ? The kind of changes you find. ? Whether you have pain. ? Size and location of any lumps.  When you last had your menstrual period. General tips  Check your breasts every month.  If you are breastfeeding, the best time to check your breasts is after you feed your baby or after you use a breast pump.  If you get menstrual periods, the best time to check your breasts is 5-7 days after your menstrual period is over.  With time, you will become comfortable with the self-exam, and you will begin to know if there are changes in your breasts. Contact a doctor if you:  See a change in the shape or size of your breasts or nipples.  See a change in the skin of your breast or nipples, such as red or scaly skin.  Have fluid coming from your nipples that is not normal.  Find a lump or thick area that was not there before.  Have pain in your breasts.  Have any concerns about your breast health. Summary  Breast self-awareness includes looking for changes in your breasts, as well as feeling for changes within your breasts.  Breast self-awareness should be done in front of a mirror in a well-lit room.  You should check your breasts every month. If you get menstrual periods, the best time to check your breasts is 5-7 days after your menstrual period is over.  Let your doctor know of any changes you see in your  breasts, including changes in size, changes on the skin, pain or tenderness, or fluid from your nipples that is not normal. This information is not intended to replace advice given to you by your health care provider. Make sure  you discuss any questions you have with your health care provider. Document Revised: 05/20/2018 Document Reviewed: 05/20/2018 Elsevier Patient Education  2021 [place] [employer] Endocrinology [id number] ed., pp. [phone]). [place], [place]: Elsevier.">  Perimenopause Perimenopause is the normal time of a woman's life when the levels of estrogen, the female hormone produced by the ovaries, begin to decrease. This leads to changes in menstrual periods before they stop completely (menopause). Perimenopause can begin 2-8 years before menopause. During perimenopause, the ovaries may or may not produce an egg and a woman can still become pregnant. What are the causes? This condition is caused by a natural change in hormone levels that happens as you get older. What increases the risk? This condition is more likely to start at an earlier age if you have certain medical conditions or have undergone treatments, including:  A tumor of the pituitary gland in the brain.  A disease that affects the ovaries and hormone production.  Certain cancer treatments, such as chemotherapy or hormone therapy, or radiation therapy on the pelvis.  Heavy smoking and excessive alcohol use.  Family history of early menopause. What are the signs or symptoms? Perimenopausal changes affect each woman differently. Symptoms of this condition may include:  Hot flashes.  Irregular menstrual periods.  Night sweats.  Changes in feelings about sex. This could be a decrease in sex drive or an increased discomfort around your sexuality.  Vaginal dryness.  Headaches.  Mood swings.  Depression.  Problems sleeping (insomnia).  Memory problems or trouble  concentrating.  Irritability.  Tiredness.  Weight gain.  Anxiety.  Trouble getting pregnant. How is this diagnosed? This condition is diagnosed based on your medical history, a physical exam, your age, your menstrual history, and your symptoms. Hormone tests may also be done. How is this treated? In some cases, no treatment is needed. You and your health care provider should make a decision together about whether treatment is necessary. Treatment will be based on your individual condition and preferences. Various treatments are available, such as:  Menopausal hormone therapy (MHT).  Medicines to treat specific symptoms.  Acupuncture.  Vitamin or herbal supplements. Before starting treatment, make sure to let your health care provider know if you have a personal or family history of:  Heart disease.  Breast cancer.  Blood clots.  Diabetes.  Osteoporosis. Follow these instructions at home: Medicines  Take over-the-counter and prescription medicines only as told by your health care provider.  Take vitamin supplements only as told by your health care provider.  Talk with your health care provider before starting any herbal supplements. Lifestyle  Do not use any products that contain nicotine or tobacco, such as cigarettes, e-cigarettes, and chewing tobacco. If you need help quitting, ask your health care provider.  Get at least 30 minutes of physical activity on 5 or more days each week.  Eat a balanced diet that includes fresh fruits and vegetables, whole grains, soybeans, eggs, lean meat, and low-fat dairy.  Avoid alcoholic and caffeinated beverages, as well as spicy foods. This may help prevent hot flashes.  Get 7-8 hours of sleep each night.  Dress in layers that can be removed to help you manage hot flashes.  Find ways to manage stress, such as deep breathing, meditation, or journaling.   General instructions  Keep track of your menstrual periods,  including: ? When they occur. ? How heavy they are and how long they last. ? How much time passes between periods.  Keep track of your symptoms, noting when they start, how often you have them, and how long they last.  Use vaginal lubricants or moisturizers to help with vaginal dryness and improve comfort during sex.  You can still become pregnant if you are having irregular periods. Make sure you use contraception during perimenopause if you do not want to get pregnant.  Keep all follow-up visits. This is important. This includes any group therapy or counseling.   Contact a health care provider if:  You have heavy vaginal bleeding or pass blood clots.  Your period lasts more than 2 days longer than normal.  Your periods are recurring sooner than 21 days.  You bleed after having sex.  You have pain during sex. Get help right away if you have:  Chest pain, trouble breathing, or trouble talking.  Severe depression.  Pain when you urinate.  Severe headaches.  Vision problems. Summary  Perimenopause is the time when a woman's body begins to move into menopause. This may happen naturally or as a result of other health problems or medical treatments.  Perimenopause can begin 2-8 years before menopause, and it can last for several years.  Perimenopausal symptoms can be managed through medicines, lifestyle changes, and complementary therapies such as acupuncture. This information is not intended to replace advice given to you by your health care provider. Make sure you discuss any questions you have with your health care provider. Document Revised: 03/17/2020 Document Reviewed: 03/17/2020 Elsevier Patient Education  [place].

## 2020-12-20 NOTE — Progress Notes (Unsigned)
GYNECOLOGY ANNUAL PHYSICAL EXAM PROGRESS NOTE  Subjective:    Jillian Foster is a 50 y.o. G51P1011 female who presents for an annual exam. The patient is sexually active. The patient participates in regular exercise: yes. Has the patient ever been transfused or tattooed?: no.    The patient has the following concerns today:  1. [name] complains of worsening night sweats.  She states that she initially presented the issue to her PCP, who thought it may have been due to her diabetes, but notes that she is better controlled after adding Ozempic to her regimen and is still having the symptoms. Was instructed to f/u with GYN to assess if it could be hormonal (perimenopausal/menopausal) 2. Reports being started on Hydrocloroqine and Meloxicam for possible arthritis.  3. Is taking a hiatus from her weight loss regimen currently. Has been off medication for 2 months (previously prescribed Adipex and Topamax).   Gynecologic History No LMP recorded (lmp unknown). (Menstrual status: IUD). Contraception: Mirena IUD , inserted 09/2018.  History of STI's: Denies.  Last Pap: 06/25/2017. Results were: normal.  Has a h/o abnormal pap smear in 2015. Last mammogram: 01/20/2020. Marland Kitchen Results were: normal.     OB History  Gravida Para Term Preterm AB Living  2 1 1  0 1 1  SAB IAB Ectopic Multiple Live Births  0 0 0 0 1    # Outcome Date GA Lbr Len/2nd Weight Sex Delivery Anes PTL Lv  2 Term 05/16/00   8 lb 2 oz (3.685 kg) F CS-LTranv  N LIV     Complications: Breech birth  1 AB              Past Medical History:  Diagnosis Date  . 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 Anxiety   . Diabetes mellitus without complication ([place])   . Genital herpes   . Goiter   . History of abnormal cervical Pap smear 2015   POsitive HRHPV  . Insomnia     Past Surgical History:  Procedure Laterality Date  . BIOPSY THYROID  01/18/2014   B9 FNA  . BREAST BIOPSY  08/2010   Left benign   Fibroadenoma  .  CESAREAN SECTION  05/2000   Failed ECV..Breech  . CHOLECYSTECTOMY    . COLPOSCOPY    . COMBINED HYSTEROSCOPY DIAGNOSTIC / D&C    . INTRAUTERINE DEVICE (IUD) INSERTION N/A 10/10/2018   Procedure: INTRAUTERINE DEVICE (IUD) INSERTION;  Surgeon: Rubie Maid, MD;  Location: [facility] OR[facility];  Service: Gynecology;  Laterality: N/A;  . INTRAUTERINE DEVICE INSERTION  08/25/2013  . IUD REMOVAL N/A 10/10/2018   Procedure: INTRAUTERINE DEVICE (IUD) REMOVAL;  Surgeon: Rubie Maid, MD;  Location: [facility] OR[facility];  Service: Gynecology;  Laterality: N/A;  CERVICAL DILATION    Family History  Problem Relation Age of Onset  . Diabetes Maternal Grandmother        Type 2  . Healthy Father   . Healthy Mother   . Cancer Neg Hx   . Hypertension Neg Hx   . Stroke Neg Hx   . Thyroid disease Neg Hx   . Breast cancer Neg Hx     Social History   Socioeconomic History  . Marital status: Single    Spouse name: Not on file  . Number of children: 1  . Years of education: Not on file  . Highest education level: Not on file  Occupational History  . Not on file  Tobacco Use  . Smoking status:  Never Smoker  . Smokeless tobacco: Never Used  Vaping Use  . Vaping Use: Never used  Substance and Sexual Activity  . Alcohol use: Yes  . Drug use: No  . Sexual activity: Yes    Partners: Male    Birth control/protection: I.U.D.  Other Topics Concern  . Not on file  Social History Narrative  . Not on file   Social Determinants of Health   Financial Resource Strain: Not on file  Food Insecurity: Not on file  Transportation Needs: Not on file  Physical Activity: Not on file  Stress: Not on file  Social Connections: Not on file  Intimate Partner Violence: Not on file    Current Outpatient Medications on File Prior to Visit  Medication Sig Dispense Refill  . Blood Glucose Monitoring Suppl (GLUCOCOM BLOOD GLUCOSE MONITOR) DEVI One Touch Verio meter. Use to check sugar once daily.    Marland Kitchen glucose blood test strip Use  once daily.    Marland Kitchen ibuprofen (ADVIL,MOTRIN) 800 MG tablet Take 1 tablet (800 mg total) by mouth every 8 (eight) hours as needed. 60 tablet 1  . insulin glargine (LANTUS) 100 UNIT/ML injection Inject into the skin daily. 18 units nightly    . levonorgestrel (MIRENA) 20 MCG/24HR IUD 1 each by Intrauterine route once.    . meloxicam (MOBIC) 7.5 MG tablet Take 7.5 mg by mouth daily.    . metFORMIN (GLUCOPHAGE) 500 MG tablet Take 500 mg by mouth 2 (two) times daily with a meal.    . OZEMPIC, 1 MG/DOSE, 4 MG/3ML SOPN     . pravastatin (PRAVACHOL) 20 MG tablet Take 20 mg by mouth daily.    . phentermine (ADIPEX-P) 37.5 MG tablet Take 1 tablet (37.5 mg total) by mouth daily before breakfast. (Patient not taking: Reported on 12/20/2020) 30 tablet 0   No current facility-administered medications on file prior to visit.    No Known Allergies    Review of Systems Constitutional: negative for chills, fatigue, fevers and sweats. Positive for occasional hot flushes Eyes: negative for irritation, redness and visual disturbance Ears, nose, mouth, throat, and face: negative for hearing loss, nasal congestion, snoring and tinnitus Respiratory: negative for asthma, cough, sputum Cardiovascular: negative for chest pain, dyspnea, exertional chest pressure/discomfort, irregular heart beat, palpitations and syncope Gastrointestinal: negative for abdominal pain, change in bowel habits, nausea and vomiting Genitourinary: negative for abnormal menstrual periods, genital lesions, sexual problems and vaginal discharge, dysuria and urinary incontinence Integument/breast: negative for breast lump, breast tenderness and nipple discharge Hematologic/lymphatic: negative for bleeding and easy bruising Musculoskeletal:negative for back pain and muscle weakness Neurological: negative for dizziness, headaches, vertigo and weakness Endocrine: negative for diabetic symptoms including polydipsia, polyuria and skin  dryness Allergic/Immunologic: negative for hay fever and urticaria        Objective:  Blood pressure 137/84, pulse 96, height 5' 7"  (1.702 m), weight 181 lb 8 oz (82.3 kg). Body mass index is 28.43 kg/m.   General Appearance:    Alert, cooperative, no distress, appears stated age, overweight  Head:    Normocephalic, without obvious abnormality, atraumatic  Eyes:    PERRL, conjunctiva/corneas clear, EOM's intact, both eyes  Ears:    Normal external ear canals, both ears  Nose:   Nares normal, septum midline, mucosa normal, no drainage or sinus tenderness  Throat:   Lips, mucosa, and tongue normal; teeth and gums normal  Neck:   Supple, symmetrical, trachea midline, no adenopathy; thyroid: no enlargement/tenderness/nodules; no carotid bruit or JVD  Back:     Symmetric, no curvature, ROM normal, no CVA tenderness  Lungs:     Clear to auscultation bilaterally, respirations unlabored  Chest Wall:    No tenderness or deformity   Heart:    Regular rate and rhythm, S1 and S2 normal, no murmur, rub or gallop  Breast Exam:    No tenderness, masses, or nipple abnormality  Abdomen:     Soft, non-tender, bowel sounds active all four quadrants, no masses, no organomegaly.    Genitalia:    Pelvic:external genitalia normal, vagina without lesions, discharge, or tenderness, rectovaginal septum  normal. Cervix normal in appearance, no cervical motion tenderness, IUD threads visualized, 3 cm in length. No adnexal masses or tenderness.  Uterus normal size, shape, mobile, regular contours, nontender.  Rectal:    Normal external sphincter.  No hemorrhoids appreciated. Internal exam not done.   Extremities:   Extremities normal, atraumatic, no cyanosis or edema  Pulses:   2+ and symmetric all extremities  Skin:   Skin color, texture, turgor normal, no rashes or lesions  Lymph nodes:   Cervical, supraclavicular, and axillary nodes normal  Neurologic:   CNII-XII intact, normal strength, sensation and reflexes  throughout   .  Labs:  Lab Results  Component Value Date   WBC 6.3 12/21/2019   HGB 13.3 12/21/2019   HCT 41.2 12/21/2019   MCV 87 12/21/2019   PLT 312 12/21/2019    Lab Results  Component Value Date   CREATININE 0.78 03/18/2019   BUN 14 03/18/2019   NA 140 03/18/2019   K 4.9 03/18/2019   CL 101 03/18/2019   CO2 26 03/18/2019    Lab Results  Component Value Date   ALT 16 03/18/2019   AST 14 03/18/2019   ALKPHOS 94 03/18/2019   BILITOT 0.4 03/18/2019    Lab Results  Component Value Date   TSH 1.320 12/21/2019    Assessment:    Encounter for well woman exam with routine gynecological exam Breast cancer screening by mammogram Diabetes mellitus without complication ([place]) Overweight (BMI 25.0-29.9) Night sweats  Plan:    - Blood tests: CBC with diff, Comprehensive metabolic panel, Lipoproteins, TSH and Vitamin D. - Breast self exam technique reviewed and patient encouraged to perform self-exam monthly. - Contraception: IUD.  Due for removal 5 years.  - Discussed healthy lifestyle modifications. - Mammogram ordered. - Pap smear performed today. - Up to date on flu vaccine.  - COVID vaccination series completed, including booster.  - Will check hormone levels (FSH/LH) to assess for menopausal status. Also will check TSH due to night seats.  - DM managed by PCP.  - Follow up in 1 year for annual exam.     Rubie Maid, MD [facility]

## 2020-12-20 NOTE — Progress Notes (Signed)
Pt present for annual exam. Pt stated that she was doing well no problems.

## 2020-12-21 ENCOUNTER — Encounter: Payer: Self-pay | Admitting: Obstetrics and Gynecology

## 2020-12-21 ENCOUNTER — Other Ambulatory Visit: Payer: Self-pay

## 2020-12-21 DIAGNOSIS — Z1211 Encounter for screening for malignant neoplasm of colon: Secondary | ICD-10-CM

## 2020-12-21 LAB — THYROID PANEL WITH TSH
Free Thyroxine Index: 1.9 (ref 1.2–4.9)
T3 Uptake Ratio: 29 % (ref 24–39)
T4, Total: 6.5 ug/dL (ref 4.5–12.0)
TSH: 0.805 u[IU]/mL (ref 0.450–4.500)

## 2020-12-21 LAB — FSH/LH
FSH: 95.6 m[IU]/mL
LH: 69.2 m[IU]/mL

## 2020-12-26 LAB — CYTOLOGY - PAP
Adequacy: ABSENT
Comment: NEGATIVE
High risk HPV: NEGATIVE

## 2021-01-08 LAB — COLOGUARD: COLOGUARD: NEGATIVE

## 2021-01-08 LAB — EXTERNAL GENERIC LAB PROCEDURE: COLOGUARD: NEGATIVE

## 2021-01-23 DIAGNOSIS — M654 Radial styloid tenosynovitis [de Quervain]: Secondary | ICD-10-CM | POA: Insufficient documentation

## 2021-02-14 ENCOUNTER — Ambulatory Visit (INDEPENDENT_AMBULATORY_CARE_PROVIDER_SITE_OTHER): Payer: Commercial Managed Care - PPO | Admitting: Obstetrics and Gynecology

## 2021-02-14 ENCOUNTER — Other Ambulatory Visit: Payer: Self-pay

## 2021-02-14 VITALS — Wt 175.0 lb

## 2021-02-14 DIAGNOSIS — E119 Type 2 diabetes mellitus without complications: Secondary | ICD-10-CM

## 2021-02-14 DIAGNOSIS — Z6827 Body mass index (BMI) 27.0-27.9, adult: Secondary | ICD-10-CM | POA: Diagnosis not present

## 2021-02-14 DIAGNOSIS — E669 Obesity, unspecified: Secondary | ICD-10-CM | POA: Diagnosis not present

## 2021-02-14 NOTE — Progress Notes (Signed)
I connected with Jillian Foster on 02/14/21 at  9:10 AM EDT by telephone and verified that I am speaking with the correct person using two identifiers.   I discussed the limitations, risks, security and privacy concerns of performing an evaluation and management service by telephone and the availability of in person appointments. I also discussed with the patient that there may be a patient responsible charge related to this service. The patient expressed understanding and agreed to proceed.  The patient was at home. I spoke with the patient from my workstation phoneThe names of people involved in this encounter were: Jillian Foster , and Jillian Foster   Gynecology Office Visit  Chief Complaint:  Chief Complaint  Patient presents with  . Weight Loss    History of Present Illness: Patientis a 50 y.o. G22P1011 female, who presents for the evaluation of weight gain. She has gained 7 pounds in the last 3 months since her last phentermine course (this weight is also with a cast on currently for orthopedic surgery). The patient states the following issues have contributed to her weight loss: has worked further on diet modification. The patient has no additional symptoms. The patient specifically denies memory loss, muscle weakness, excessive thirst, and polyuria. Weight related co-morbidities include DM2. The patient's past medical history is notable for DM2. She has tried phentermine interventions in the past with good success.   Review of Systems: 10 point review of systems negative unless otherwise noted in HPI  Past Medical History:  Patient Active Problem List   Diagnosis Date Noted  . Vitamin D deficiency 10/21/2018  . History of abnormal cervical Pap smear     POsitive HRHPV   . Goiter   . Genital herpes   . Diabetes mellitus without complication ([place])   . Anxiety     Past Surgical History:  Past Surgical History:  Procedure Laterality Date  . BIOPSY  THYROID  01/18/2014   B9 FNA  . BREAST BIOPSY  08/2010   Left benign   Fibroadenoma  . CESAREAN SECTION  05/2000   Failed ECV..Breech  . CHOLECYSTECTOMY    . COLPOSCOPY    . COMBINED HYSTEROSCOPY DIAGNOSTIC / D&C    . INTRAUTERINE DEVICE (IUD) INSERTION N/A 10/10/2018   Procedure: INTRAUTERINE DEVICE (IUD) INSERTION;  Surgeon: Rubie Maid, MD;  Location: [facility];  Service: Gynecology;  Laterality: N/A;  . INTRAUTERINE DEVICE INSERTION  08/25/2013  . IUD REMOVAL N/A 10/10/2018   Procedure: INTRAUTERINE DEVICE (IUD) REMOVAL;  Surgeon: Rubie Maid, MD;  Location: [facility];  Service: Gynecology;  Laterality: N/A;  CERVICAL DILATION    Gynecologic History: No LMP recorded. (Menstrual status: IUD).  Obstetric History: G2P1011  Family History:  Family History  Problem Relation Age of Onset  . Diabetes Maternal Grandmother        Type 2  . Healthy Father   . Healthy Mother   . Cancer Neg Hx   . Hypertension Neg Hx   . Stroke Neg Hx   . Thyroid disease Neg Hx   . Breast cancer Neg Hx     Social History:  Social History   Socioeconomic History  . Marital status: Single    Spouse name: Not on file  . Number of children: 1  . Years of education: Not on file  . Highest education level: Not on file  Occupational History  . Not on file  Tobacco Use  . Smoking status: Never Smoker  . Smokeless tobacco: Never Used  Vaping Use  . Vaping Use: Never used  Substance and Sexual Activity  . Alcohol use: Yes  . Drug use: No  . Sexual activity: Yes    Partners: Male    Birth control/protection: I.U.D.  Other Topics Concern  . Not on file  Social History Narrative  . Not on file   Social Determinants of Health   Financial Resource Strain: Not on file  Food Insecurity: Not on file  Transportation Needs: Not on file  Physical Activity: Not on file  Stress: Not on file  Social Connections: Not on file  Intimate Partner Violence: Not on file    Allergies:  No Known  Allergies  Medications: Prior to Admission medications   Medication Sig Start Date End Date Taking? Authorizing Provider  Blood Glucose Monitoring Suppl (GLUCOCOM BLOOD GLUCOSE MONITOR) DEVI One Touch Verio meter. Use to check sugar once daily. 12/19/16   [provider]  glucose blood test strip Use once daily. 02/02/15   [provider]  hydroxychloroquine (PLAQUENIL) 200 MG tablet Take 200 mg by mouth in the morning and at bedtime. 12/02/20 12/02/21  [provider]  ibuprofen (ADVIL,MOTRIN) 800 MG tablet Take 1 tablet (800 mg total) by mouth every 8 (eight) hours as needed. 10/10/18   Rubie Maid, MD  insulin glargine (LANTUS) 100 UNIT/ML injection Inject into the skin daily. 18 units nightly    [provider]  levonorgestrel (MIRENA) 20 MCG/24HR IUD 1 each by Intrauterine route once.    [provider]  meloxicam (MOBIC) 7.5 MG tablet Take 7.5 mg by mouth daily.    [provider]  metFORMIN (GLUCOPHAGE) 500 MG tablet Take 500 mg by mouth 2 (two) times daily with a meal.    [provider]  [place], 1 MG/DOSE, 4 MG/3ML SOPN  06/10/20   [provider]  phentermine (ADIPEX-P) 37.5 MG tablet Take 1 tablet (37.5 mg total) by mouth daily before breakfast. Patient not taking: Reported on 12/20/2020 10/11/20   Jillian Mood, MD  pravastatin (PRAVACHOL) 20 MG tablet Take 20 mg by mouth daily.    [provider]    Physical Exam Weight 175 lb (79.4 kg). Body mass index is 27.41 kg/m. No LMP recorded. (Menstrual status: IUD).  No physical exam as this was a remote telephone visit to promote social distancing during the current COVID-19 Pandemic   Assessment: 50 y.o. [id number] presenting for discussion of weight loss management options  Plan: Problem List Items Addressed This Visit   None   Visit Diagnoses    Obesity with serious comorbidity, unspecified classification, unspecified obesity type    -  Primary    Type 2 diabetes mellitus without complication, without long-term current use of insulin (HCC)       BMI 27.0-27.9,adult          1) 1500 Calorie ADA Diet  2) Patient education given regarding appropriate lifestyle changes for weight loss including: regular physical activity, healthy coping strategies, caloric restriction and healthy eating patterns.  3) Patient will be started on weight loss medication. The risks and benefits and side effects of medication, such as Adipex (Phenteramine) ,  Tenuate (Diethylproprion), Belviq (lorcarsin), Contrave (buproprion/naltrexone), Qsymia (phentermine/topiramate), and Saxenda (liraglutide) is discussed. The pros and cons of suppressing appetite and boosting metabolism is discussed. Risks of tolerence and addiction is discussed for selected agents discussed. Use of medicine will ne short term, such as 3-4 months at a time followed by a period of time off of the  medicine to avoid these risks and side effects for Adipex, Qsymia, and Tenuate discussed. Pt to call with any negative side effects and agrees to keep follow up appts.  4) Comorbidity Screening - hypothyroidism screening, diabetes, and hyperlipidemia screening managed per PCP  5) Encouraged weekly weight monitorig to track progress and sample 1 week food diary  6) Contraception - discussed that all weight loss drugs fall in to pregnancy category X, patient currently has reliable contraception in the form of IUD  7) Telephone Time 47mn  8) Return in about 4 weeks (around 03/14/2021) for medication follow up.   AMalachy Mood MD, FMobridgeOB/GYN, [place]outh [place]5/12/2020, 8:48 AM

## 2021-02-28 ENCOUNTER — Other Ambulatory Visit: Payer: Self-pay | Admitting: Obstetrics and Gynecology

## 2021-03-14 ENCOUNTER — Other Ambulatory Visit: Payer: Self-pay

## 2021-03-14 ENCOUNTER — Ambulatory Visit: Payer: Commercial Managed Care - PPO | Admitting: Obstetrics and Gynecology

## 2021-03-29 ENCOUNTER — Other Ambulatory Visit: Payer: Self-pay

## 2021-03-29 ENCOUNTER — Ambulatory Visit
Admission: RE | Admit: 2021-03-29 | Discharge: 2021-03-29 | Disposition: A | Discharge status: Home / Self Care | Payer: Commercial Managed Care - PPO | Source: Ambulatory Visit | Attending: Obstetrics and Gynecology | Admitting: Obstetrics and Gynecology

## 2021-03-29 DIAGNOSIS — Z1231 Encounter for screening mammogram for malignant neoplasm of breast: Secondary | ICD-10-CM

## 2021-04-14 ENCOUNTER — Other Ambulatory Visit: Payer: Self-pay

## 2021-04-14 ENCOUNTER — Ambulatory Visit (INDEPENDENT_AMBULATORY_CARE_PROVIDER_SITE_OTHER): Payer: Commercial Managed Care - PPO | Admitting: Obstetrics and Gynecology

## 2021-04-14 ENCOUNTER — Encounter: Payer: Self-pay | Admitting: Obstetrics and Gynecology

## 2021-04-14 VITALS — Ht 67.0 in | Wt 178.0 lb

## 2021-04-14 DIAGNOSIS — E663 Overweight: Secondary | ICD-10-CM | POA: Diagnosis not present

## 2021-04-14 DIAGNOSIS — E119 Type 2 diabetes mellitus without complications: Secondary | ICD-10-CM | POA: Diagnosis not present

## 2021-04-14 DIAGNOSIS — Z794 Long term (current) use of insulin: Secondary | ICD-10-CM

## 2021-04-14 DIAGNOSIS — Z6827 Body mass index (BMI) 27.0-27.9, adult: Secondary | ICD-10-CM | POA: Diagnosis not present

## 2021-04-14 MED ORDER — PHENTERMINE HCL 37.5 MG PO TABS: 37.5000 mg | ORAL_TABLET | Freq: Every day | ORAL | 0 refills | Status: DC

## 2021-04-14 NOTE — Progress Notes (Signed)
Gynecology Office Visit  Chief Complaint:  Chief Complaint  Patient presents with   Follow-up    Phone visit - weight loss, no concerns.     History of Present Illness: Patientis a 50 y.o. G37P1011 female, who presents for the evaluation of the desire to lose weight. She has gained 3lbs1 months. The patient states the following symptoms since starting her weight loss therapy: appetite suppression, energy, and weight loss.  The patient also reports no other ill effects. The patient specifically denies heart palpitations, anxiety, and insomnia.    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    Anxiety    Diabetes mellitus without complication (HCC)    Genital herpes    Goiter    History of abnormal cervical Pap smear 2015   POsitive HRHPV   Insomnia     Past Surgical History:  Past Surgical History:  Procedure Laterality Date   BIOPSY THYROID  01/18/2014   B9 FNA   BREAST BIOPSY  08/2010   Left benign   Fibroadenoma   CESAREAN SECTION  05/2000   Failed ECV..Breech   CHOLECYSTECTOMY     COLPOSCOPY     COMBINED HYSTEROSCOPY DIAGNOSTIC / D&C     INTRAUTERINE DEVICE (IUD) INSERTION N/A 10/10/2018   Procedure: INTRAUTERINE DEVICE (IUD) INSERTION;  Surgeon: Rubie Maid, MD;  Location: [facility];  Service: Gynecology;  Laterality: N/A;   INTRAUTERINE DEVICE INSERTION  08/25/2013   IUD REMOVAL N/A 10/10/2018   Procedure: INTRAUTERINE DEVICE (IUD) REMOVAL;  Surgeon: Rubie Maid, MD;  Location: [facility];  Service: Gynecology;  Laterality: N/A;  CERVICAL DILATION    Gynecologic History: No LMP recorded. (Menstrual status: IUD).  Obstetric History: G2P1011  Family History:  Family History  Problem Relation Age of Onset   Diabetes Maternal Grandmother        Type 2   Healthy Father    Healthy Mother    Cancer Neg Hx     Hypertension Neg Hx    Stroke Neg Hx    Thyroid disease Neg Hx    Breast cancer Neg Hx     Social History:  Social History   Socioeconomic History   Marital status: Single    Spouse name: Not on file   Number of children: 1   Years of education: Not on file   Highest education level: Not on file  Occupational History   Not on file  Tobacco Use   Smoking status: Never   Smokeless tobacco: Never  Vaping Use   Vaping Use: Never used  Substance and Sexual Activity   Alcohol use: Yes   Drug use: No   Sexual activity: Yes    Partners: Male    Birth control/protection: I.U.D.  Other Topics Concern   Not on file  Social History Narrative   Not on file   Social Determinants of Health   Financial Resource Strain: Not on file  Food Insecurity: Not on file  Transportation Needs: Not on file  Physical Activity: Not on file  Stress: Not on file  Social Connections: Not on file  Intimate Partner Violence: Not on file    Allergies:  No Known Allergies  Medications: Prior to Admission medications   Medication Sig Start Date End Date Taking? Authorizing Provider  Blood Glucose Monitoring Suppl (GLUCOCOM BLOOD GLUCOSE MONITOR) DEVI One Touch  Verio meter. Use to check sugar once daily. 12/19/16   [provider]  glucose blood test strip Use once daily. 02/02/15   [provider]  hydroxychloroquine (PLAQUENIL) 200 MG tablet Take 200 mg by mouth in the morning and at bedtime. 12/02/20 12/02/21  [provider]  ibuprofen (ADVIL,MOTRIN) 800 MG tablet Take 1 tablet (800 mg total) by mouth every 8 (eight) hours as needed. 10/10/18   Rubie Maid, MD  insulin glargine (LANTUS) 100 UNIT/ML injection Inject into the skin daily. 18 units nightly    [provider]  levonorgestrel (MIRENA) 20 MCG/24HR IUD 1 each by Intrauterine route once.    [provider]  meloxicam (MOBIC) 7.5 MG tablet Take 7.5 mg by mouth daily.    [provider]   metFORMIN (GLUCOPHAGE) 500 MG tablet Take 500 mg by mouth 2 (two) times daily with a meal.    [provider]  [place], 1 MG/DOSE, 4 MG/3ML SOPN  06/10/20   [provider]  phentermine (ADIPEX-P) 37.5 MG tablet TAKE 1 TABLET BY MOUTH DAILY BEFORE BREAKFAST 03/01/21   [name], MD  pravastatin (PRAVACHOL) 20 MG tablet Take 20 mg by mouth daily.    [provider]    Physical Exam Height 5' 7"  (1.702 m), weight 178 lb (80.7 kg). Wt Readings from Last 3 Encounters:  04/14/21 178 lb (80.7 kg)  02/14/21 175 lb (79.4 kg)  12/20/20 181 lb 8 oz (82.3 kg)  Body mass index is 27.88 kg/m.  No physical exam as this was a remote telephone visit to promote social distancing during the current COVID-19 Pandemic   Assessment: 50 y.o. G2P1011 weight loss follow  Plan: Problem List Items Addressed This Visit   None Visit Diagnoses     Overweight (BMI 25.0-29.9)    -  Primary   BMI 27.0-27.9,adult       Type 2 diabetes mellitus without complication, with long-term current use of insulin ([place])           1) 1500 Calorie ADA Diet  2) Patient education given regarding appropriate lifestyle changes for weight loss including: regular physical activity, healthy coping strategies, caloric restriction and healthy eating patterns.  3) Patient to take medication, with the benefits of appetite suppression and metabolism boost d/w pt, along with the side effects and risk factors of long term use that will be avoided with our use of short bursts of therapy. Rx provided.  - also on ozempic and metformin for DM II.  Discussed bringing up to endocrin taking ozempic dose up to 98m max dose to see if any additional benefit for weight loss  5) Telephone time 8:270m  6) Return in about 4 weeks (around 05/12/2021) for medication follow up (phone or in person).    AnMalachy MoodMD, FAEdna BayB/GYN, CoKremmlingroup 04/14/2021, 9:39 AM

## 2021-05-02 ENCOUNTER — Other Ambulatory Visit: Payer: Self-pay | Admitting: Obstetrics and Gynecology

## 2021-05-02 MED ORDER — PHENTERMINE HCL 37.5 MG PO TABS: 37.5000 mg | ORAL_TABLET | Freq: Every day | ORAL | 0 refills | Status: DC

## 2021-05-12 ENCOUNTER — Other Ambulatory Visit: Payer: Self-pay

## 2021-05-12 ENCOUNTER — Encounter: Payer: Self-pay | Admitting: Obstetrics and Gynecology

## 2021-05-12 ENCOUNTER — Ambulatory Visit (INDEPENDENT_AMBULATORY_CARE_PROVIDER_SITE_OTHER): Payer: Commercial Managed Care - PPO | Admitting: Obstetrics and Gynecology

## 2021-05-12 VITALS — Ht 67.0 in | Wt 173.2 lb

## 2021-05-12 DIAGNOSIS — Z6827 Body mass index (BMI) 27.0-27.9, adult: Secondary | ICD-10-CM

## 2021-05-12 DIAGNOSIS — E119 Type 2 diabetes mellitus without complications: Secondary | ICD-10-CM | POA: Diagnosis not present

## 2021-05-12 MED ORDER — PHENTERMINE HCL 37.5 MG PO TABS: 37.5000 mg | ORAL_TABLET | Freq: Every day | ORAL | 0 refills | Status: DC

## 2021-05-12 NOTE — Progress Notes (Signed)
I connected with Jillian Foster on 05/12/21 at 11:10 AM EDT by telephone and verified that I am speaking with the correct person using two identifiers.   I discussed the limitations, risks, security and privacy concerns of performing an evaluation and management service by telephone and the availability of in person appointments. I also discussed with the patient that there may be a patient responsible charge related to this service. The patient expressed understanding and agreed to proceed.  The patient was at home I spoke with the patient from my workstation phone The names of people involved in this encounter were: Jillian Foster , and [name]   Gynecology Office Visit  Chief Complaint:  Chief Complaint  Patient presents with   Weight Check    History of Present Illness: Patientis a 50 y.o. G65P1011 female, who presents for the evaluation of the desire to lose weight. She has lost 5 pounds 1 months. The patient states the following symptoms since starting her weight loss therapy: appetite suppression, energy, and weight loss.  The patient also reports no other ill effects. The patient specifically denies heart palpitations, anxiety, and insomnia.    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    Anxiety    Diabetes mellitus without complication (HCC)    Genital herpes    Goiter    History of abnormal cervical Pap smear 2015   POsitive HRHPV   Insomnia     Past Surgical History:  Past Surgical History:  Procedure Laterality Date   BIOPSY THYROID  01/18/2014   B9 FNA   BREAST BIOPSY  08/2010   Left benign   Fibroadenoma   CESAREAN SECTION  05/2000   Failed ECV..Breech   CHOLECYSTECTOMY     COLPOSCOPY     COMBINED HYSTEROSCOPY DIAGNOSTIC / D&C     INTRAUTERINE DEVICE (IUD) INSERTION N/A 10/10/2018    Procedure: INTRAUTERINE DEVICE (IUD) INSERTION;  Surgeon: Rubie Maid, MD;  Location: [facility];  Service: Gynecology;  Laterality: N/A;   INTRAUTERINE DEVICE INSERTION  08/25/2013   IUD REMOVAL N/A 10/10/2018   Procedure: INTRAUTERINE DEVICE (IUD) REMOVAL;  Surgeon: Rubie Maid, MD;  Location: [facility];  Service: Gynecology;  Laterality: N/A;  CERVICAL DILATION    Gynecologic History: No LMP recorded. (Menstrual status: IUD).  Obstetric History: G2P1011  Family History:  Family History  Problem Relation Age of Onset   Diabetes Maternal Grandmother        Type 2   Healthy Father    Healthy Mother    Cancer Neg Hx    Hypertension Neg Hx    Stroke Neg Hx    Thyroid disease Neg Hx    Breast cancer Neg Hx     Social History:  Social History   Socioeconomic History   Marital status: Single    Spouse name: Not on file   Number of children: 1   Years of education: Not on file   Highest education level: Not on file  Occupational History   Not on file  Tobacco Use   Smoking status: Never   Smokeless tobacco: Never  Vaping Use   Vaping Use: Never used  Substance and Sexual Activity   Alcohol use: Yes   Drug use: No   Sexual activity: Yes    Partners: Male    Birth control/protection: I.U.D.  Other Topics Concern   Not on file  Social History Narrative   Not on file   Social Determinants of Health   Financial Resource Strain: Not on file  Food Insecurity: Not on file  Transportation Needs: Not on file  Physical Activity: Not on file  Stress: Not on file  Social Connections: Not on file  Intimate Partner Violence: Not on file    Allergies:  No Known Allergies  Medications: Prior to Admission medications   Medication Sig Start Date End Date Taking? Authorizing Provider  Blood Glucose Monitoring Suppl (GLUCOCOM BLOOD GLUCOSE MONITOR) DEVI One Touch Verio meter. Use to check sugar once daily. 12/19/16  Yes [provider]  glucose blood test strip Use  once daily. 02/02/15  Yes [provider]  hydroxychloroquine (PLAQUENIL) 200 MG tablet Take 200 mg by mouth in the morning and at bedtime. 12/02/20 12/02/21 Yes [provider]  ibuprofen (ADVIL,MOTRIN) 800 MG tablet Take 1 tablet (800 mg total) by mouth every 8 (eight) hours as needed. 10/10/18  Yes Rubie Maid, MD  insulin glargine (LANTUS) 100 UNIT/ML injection Inject into the skin daily. 18 units nightly   Yes [provider]  levonorgestrel (MIRENA) 20 MCG/24HR IUD 1 each by Intrauterine route once.   Yes [provider]  meloxicam (MOBIC) 7.5 MG tablet Take 7.5 mg by mouth daily.   Yes [provider]  metFORMIN (GLUCOPHAGE) 500 MG tablet Take 500 mg by mouth 2 (two) times daily with a meal.   Yes [provider]  [place], 1 MG/DOSE, 4 MG/3ML SOPN  06/10/20  Yes [provider]  phentermine (ADIPEX-P) 37.5 MG tablet Take 1 tablet (37.5 mg total) by mouth daily before breakfast. 05/02/21  Yes [name], MD  pravastatin (PRAVACHOL) 20 MG tablet Take 20 mg by mouth daily.   Yes [provider]    Physical Exam Height 5' 7"  (1.702 m), weight 173 lb 3.2 oz (78.6 kg). Wt Readings from Last 3 Encounters:  05/12/21 173 lb 3.2 oz (78.6 kg)  04/14/21 178 lb (80.7 kg)  02/14/21 175 lb (79.4 kg)  Body mass index is 27.13 kg/m.  No physical exam as this was a remote telephone visit to promote social distancing during the current COVID-19 Pandemic   Assessment: 50 y.o. [id number] medical weight loss follow  Plan: Problem List Items Addressed This Visit       Endocrine   Diabetes mellitus without complication ([place])   Other Visit Diagnoses     BMI 27.0-27.9,adult    -  Primary       1) 1500 Calorie ADA Diet  2) Patient education given regarding appropriate lifestyle changes for weight loss including: regular physical activity, healthy coping strategies, caloric restriction and healthy eating patterns.  3)  Patient to take medication, with the benefits of appetite suppression and metabolism boost d/w pt, along with the side effects and risk factors of long term use that will be avoided with our use of short bursts of therapy. Rx provided.  - will hold off on restarting until recovered from COVID  4) Telephone time 63mn  5) Return in about 4 weeks (around 06/09/2021) for medication .    AMalachy Mood MD, FLoura PardonOB/GYN, CMuttontownGroup 05/12/2021, 11:40 AM

## 2021-06-12 ENCOUNTER — Encounter: Payer: Self-pay | Admitting: Obstetrics and Gynecology

## 2021-06-12 ENCOUNTER — Ambulatory Visit (INDEPENDENT_AMBULATORY_CARE_PROVIDER_SITE_OTHER): Payer: Commercial Managed Care - PPO | Admitting: Obstetrics and Gynecology

## 2021-06-12 ENCOUNTER — Other Ambulatory Visit: Payer: Self-pay

## 2021-06-12 VITALS — Ht 67.0 in | Wt 181.0 lb

## 2021-06-12 DIAGNOSIS — E663 Overweight: Secondary | ICD-10-CM

## 2021-06-12 DIAGNOSIS — Z6828 Body mass index (BMI) 28.0-28.9, adult: Secondary | ICD-10-CM | POA: Diagnosis not present

## 2021-06-12 MED ORDER — PHENTERMINE HCL 37.5 MG PO TABS: 37.5000 mg | ORAL_TABLET | Freq: Every day | ORAL | 0 refills | Status: DC

## 2021-06-12 NOTE — Progress Notes (Signed)
I connected with Jillian Foster on 06/12/21 at 10:50 AM EDT by telephone and verified that I am speaking with the correct person using two identifiers.   I discussed the limitations, risks, security and privacy concerns of performing an evaluation and management service by telephone and the availability of in person appointments. I also discussed with the patient that there may be a patient responsible charge related to this service. The patient expressed understanding and agreed to proceed.  The patient was at home I spoke with the patient from my workstation phone The names of people involved in this encounter were: Jillian Foster , and [name]   Gynecology Office Visit  Chief Complaint:  Chief Complaint  Patient presents with   Follow-up    Phone visit - no concerns.    History of Present Illness: Patientis a 50 y.o. G86P1011 female, who presents for the evaluation of the desire to lose weight. She has gained pounds 1 months. The patient states the following symptoms since starting her weight loss therapy: appetite suppression, energy, and weight loss.  The patient also reports no other ill effects. The patient specifically denies heart palpitations, anxiety, and insomnia.   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    Anxiety    Diabetes mellitus without complication (HCC)    Genital herpes    Goiter    History of abnormal cervical Pap smear 2015   POsitive HRHPV   Insomnia     Past Surgical History:  Past Surgical History:  Procedure Laterality Date   BIOPSY THYROID  01/18/2014   B9 FNA   BREAST BIOPSY  08/2010   Left benign   Fibroadenoma   CESAREAN SECTION  05/2000   Failed ECV..Breech   CHOLECYSTECTOMY     COLPOSCOPY     COMBINED HYSTEROSCOPY DIAGNOSTIC  / D&C     INTRAUTERINE DEVICE (IUD) INSERTION N/A 10/10/2018   Procedure: INTRAUTERINE DEVICE (IUD) INSERTION;  Surgeon: Rubie Maid, MD;  Location: [facility];  Service: Gynecology;  Laterality: N/A;   INTRAUTERINE DEVICE INSERTION  08/25/2013   IUD REMOVAL N/A 10/10/2018   Procedure: INTRAUTERINE DEVICE (IUD) REMOVAL;  Surgeon: Rubie Maid, MD;  Location: [facility] OR[facility];  Service: Gynecology;  Laterality: N/A;  CERVICAL DILATION    Gynecologic History: No LMP recorded. (Menstrual status: IUD).  Obstetric History: G2P1011  Family History:  Family History  Problem Relation Age of Onset   Diabetes Maternal Grandmother        Type 2   Healthy Father    Healthy Mother    Cancer Neg Hx    Hypertension Neg Hx    Stroke Neg Hx    Thyroid disease Neg Hx    Breast cancer Neg Hx     Social History:  Social History   Socioeconomic History   Marital status: Single    Spouse name: Not on file   Number of children: 1   Years of education: Not on file   Highest education level: Not on file  Occupational History   Not on file  Tobacco Use   Smoking status: Never   Smokeless tobacco: Never  Vaping Use   Vaping Use: Never used  Substance and Sexual Activity   Alcohol use: Yes   Drug use: No   Sexual activity: Yes  Partners: Male    Birth control/protection: I.U.D.  Other Topics Concern   Not on file  Social History Narrative   Not on file   Social Determinants of Health   Financial Resource Strain: Not on file  Food Insecurity: Not on file  Transportation Needs: Not on file  Physical Activity: Not on file  Stress: Not on file  Social Connections: Not on file  Intimate Partner Violence: Not on file    Allergies:  No Known Allergies  Medications: Prior to Admission medications   Medication Sig Start Date End Date Taking? Authorizing Provider  Blood Glucose Monitoring Suppl (GLUCOCOM BLOOD GLUCOSE MONITOR) DEVI One Touch Verio meter. Use to check sugar once daily.  12/19/16   [provider]  glucose blood test strip Use once daily. 02/02/15   [provider]  hydroxychloroquine (PLAQUENIL) 200 MG tablet Take 200 mg by mouth in the morning and at bedtime. 12/02/20 12/02/21  [provider]  ibuprofen (ADVIL,MOTRIN) 800 MG tablet Take 1 tablet (800 mg total) by mouth every 8 (eight) hours as needed. 10/10/18   Rubie Maid, MD  insulin glargine (LANTUS) 100 UNIT/ML injection Inject into the skin daily. 18 units nightly    [provider]  levonorgestrel (MIRENA) 20 MCG/24HR IUD 1 each by Intrauterine route once.    [provider]  meloxicam (MOBIC) 7.5 MG tablet Take 7.5 mg by mouth daily.    [provider]  metFORMIN (GLUCOPHAGE) 500 MG tablet Take 500 mg by mouth 2 (two) times daily with a meal.    [provider]  OZEMPIC, 1 MG/DOSE, 4 MG/3ML SOPN  06/10/20   [provider]  phentermine (ADIPEX-P) 37.5 MG tablet Take 1 tablet (37.5 mg total) by mouth daily before breakfast. 05/12/21   [name], MD  pravastatin (PRAVACHOL) 20 MG tablet Take 20 mg by mouth daily.    [provider]    Physical Exam Height 5' 7"  (1.702 m), weight 181 lb (82.1 kg). Wt Readings from Last 3 Encounters:  06/12/21 181 lb (82.1 kg)  05/12/21 173 lb 3.2 oz (78.6 kg)  04/14/21 178 lb (80.7 kg)  Body mass index is 28.35 kg/m.  No physical exam as this was a remote telephone visit to promote social distancing during the current COVID-19 Pandemic   Assessment: 50 y.o. G2P1011 weight loss follow up   Plan: Problem List Items Addressed This Visit   None Visit Diagnoses     Overweight (BMI 25.0-29.9)    -  Primary   BMI 28.0-28.9,adult           1) 1500 Calorie ADA Diet  2) Patient education given regarding appropriate lifestyle changes for weight loss including: regular physical activity, healthy coping strategies, caloric restriction and healthy eating patterns.  3) Patient to  take medication, with the benefits of appetite suppression and metabolism boost d/w pt, along with the side effects and risk factors of long term use that will be avoided with our use of short bursts of therapy. Rx provided.    4) Telephone time 5:00  6) Return in about 4 weeks (around 07/10/2021) for medication follow up.    [name], MD, [facility], [place] 06/12/2021, 10:10 AM

## 2021-06-14 IMAGING — MG DIGITAL SCREENING BILAT W/ TOMO W/ CAD
8 series · 9 of 24 positions shown · non-contrast
Comparison: Previous exam(s).

CLINICAL DATA: Screening.

EXAM:
DIGITAL SCREENING BILATERAL MAMMOGRAM WITH TOMO AND CAD

[R CC synth-2D]
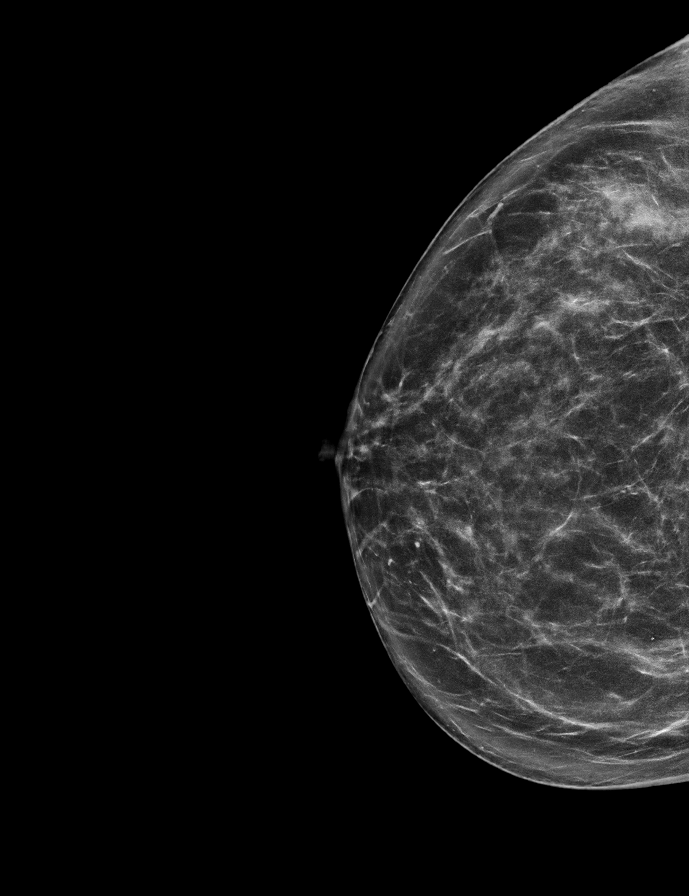

[L MLO synth-2D]
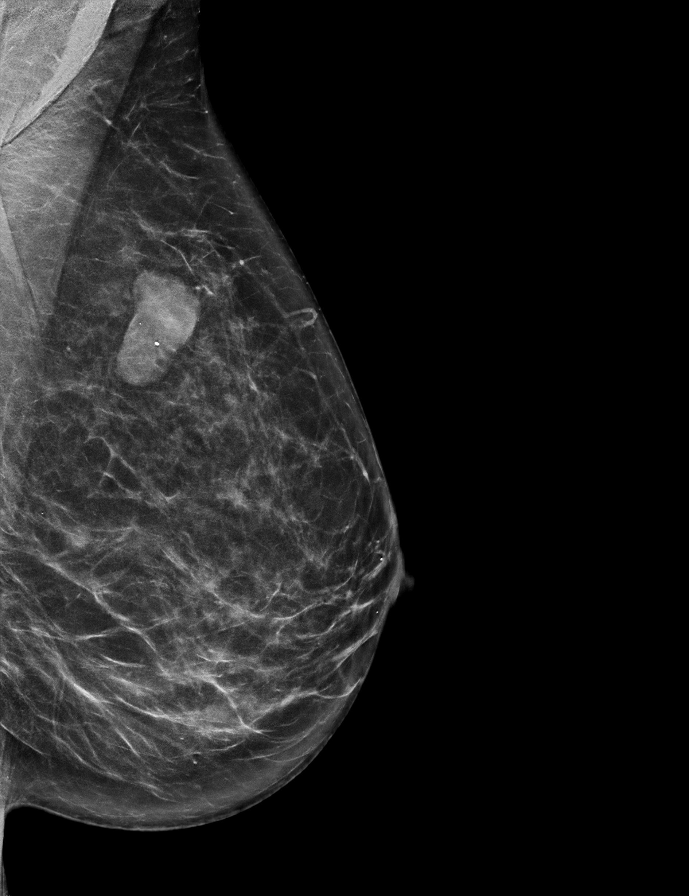

[R MLO synth-2D]
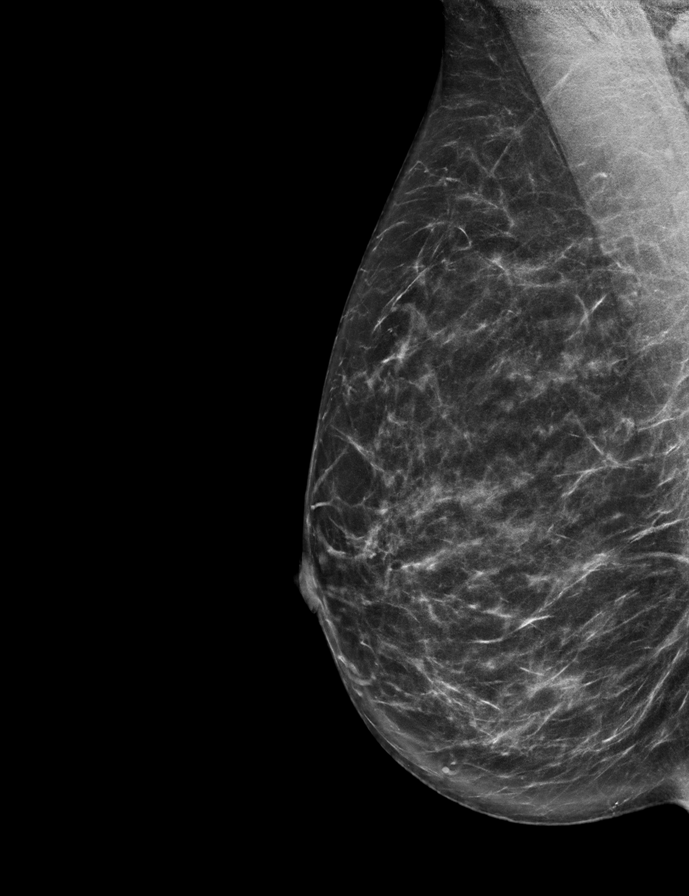

[L CC synth-2D]
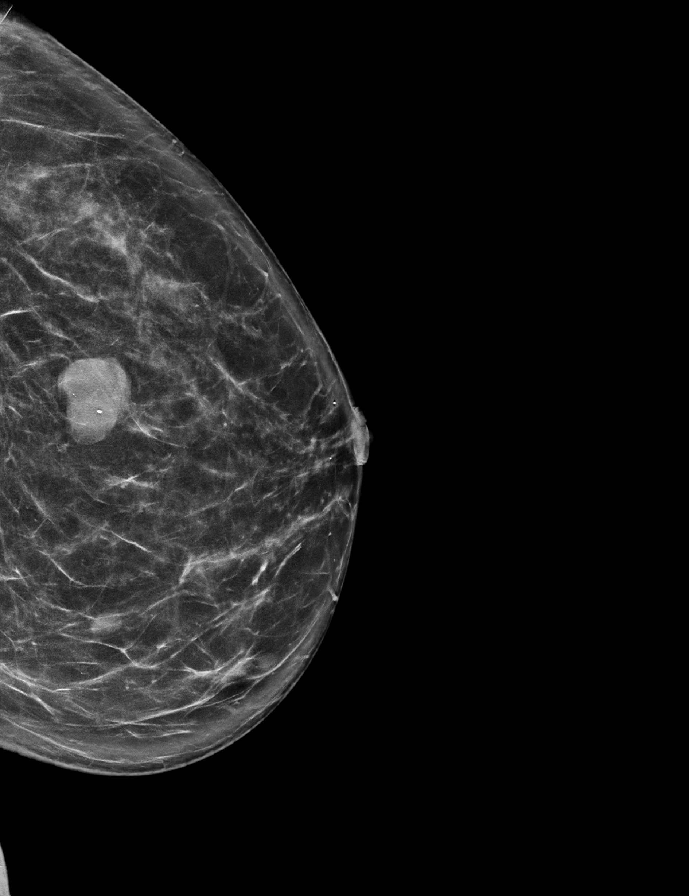

[R MLO tomo · 2 of 72 frames shown]
[frame 24/72]
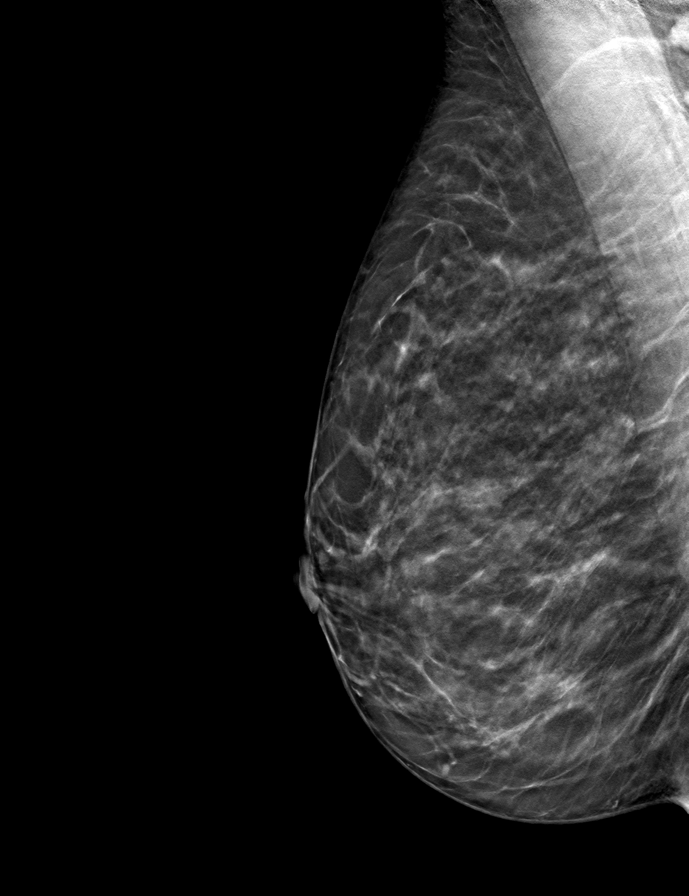
[frame 37/72]
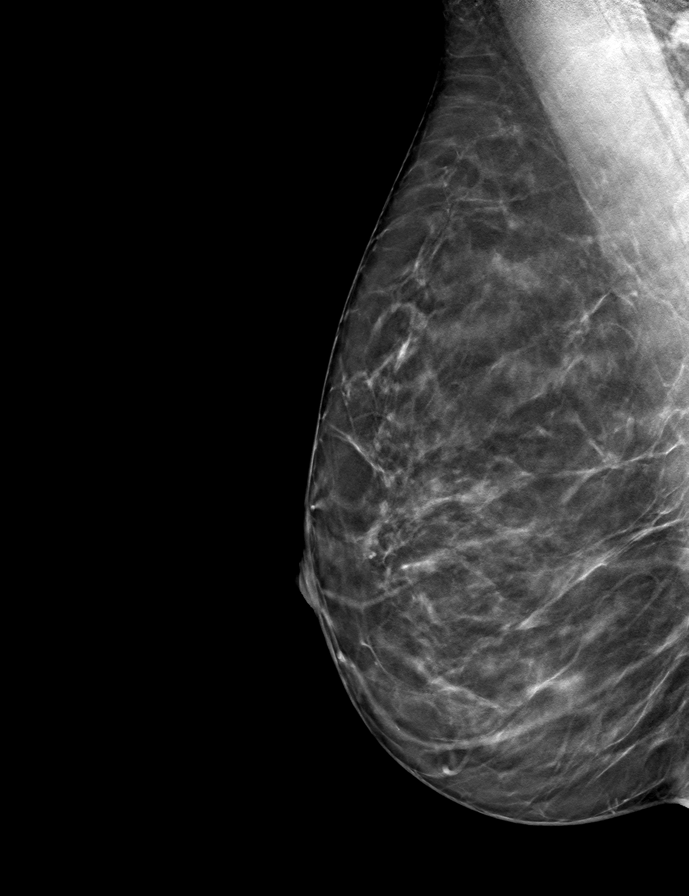

[L CC tomo · tomo slice 37/72.0]
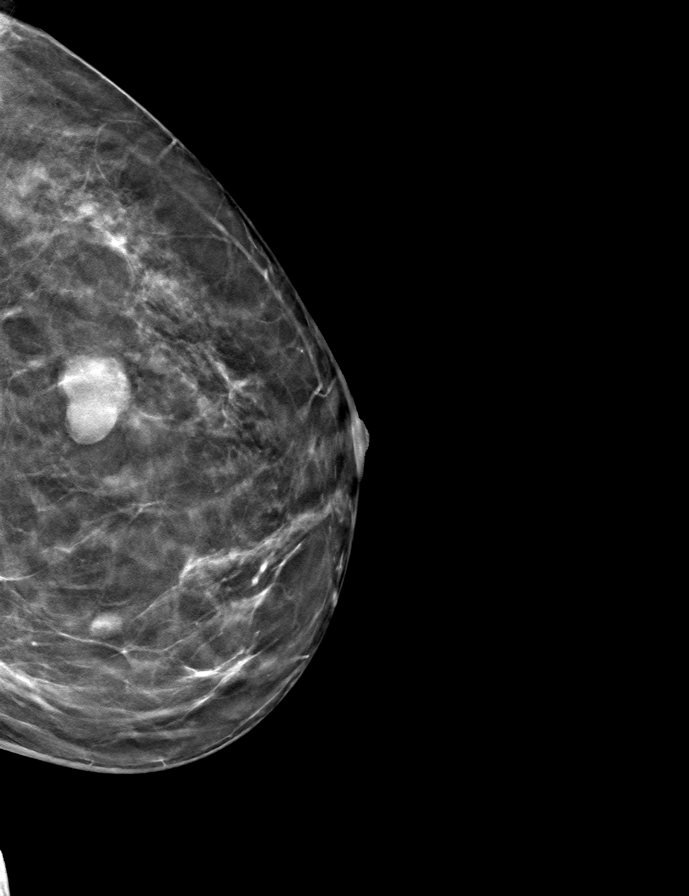

[L MLO tomo · tomo slice 37/72.0]
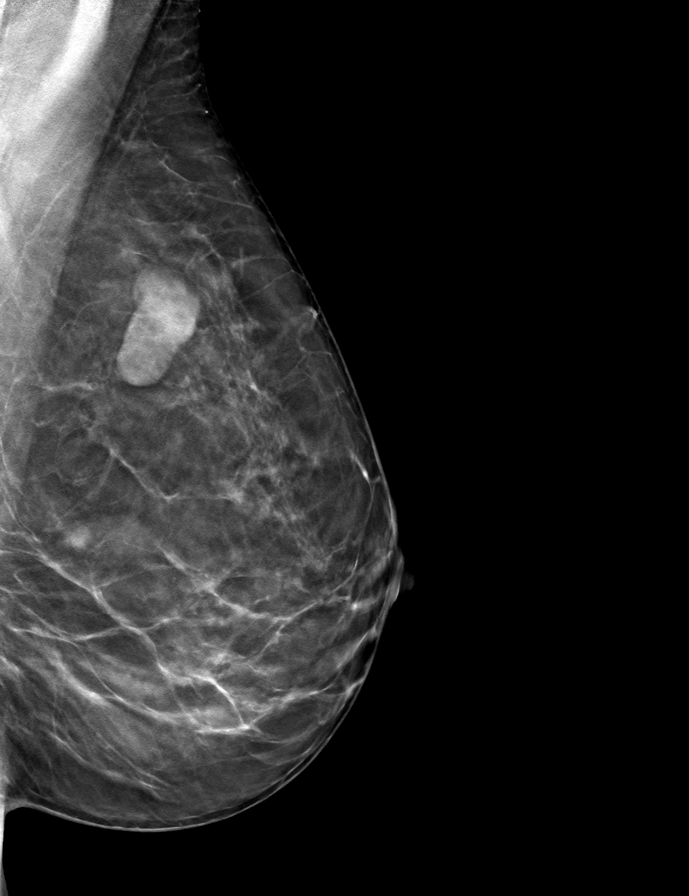

[R CC tomo · tomo slice 37/72.0]
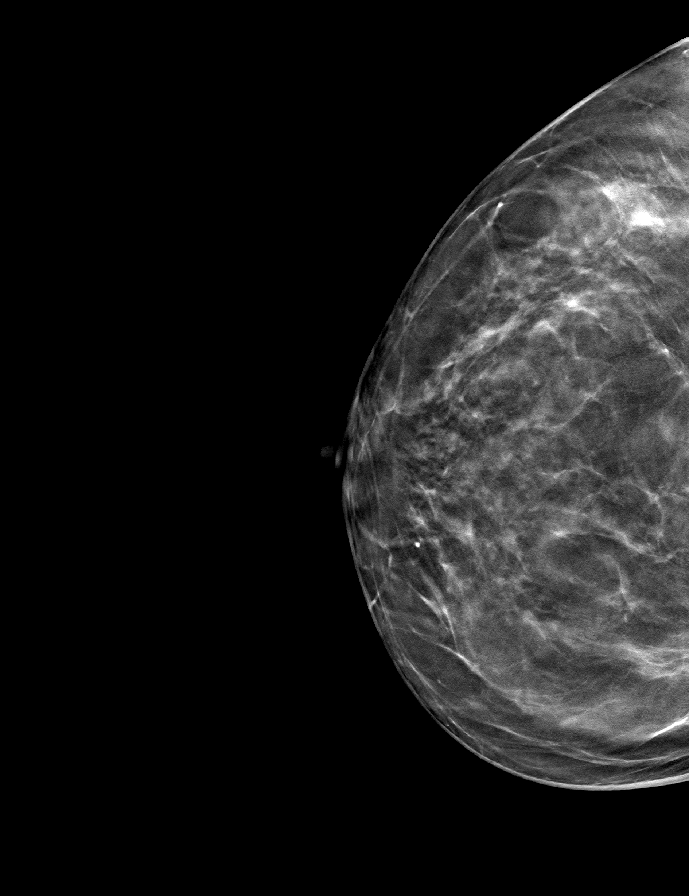

[9 of 24 positions shown; findings below may reference images not displayed]

ACR Breast Density Category b: There are scattered areas of
fibroglandular density.
FINDINGS: There are no findings suspicious for malignancy. Images were
processed with CAD.
IMPRESSION: No mammographic evidence of malignancy. A result letter of this
screening mammogram will be mailed directly to the patient.

RECOMMENDATION:
Screening mammogram in one year. (Code:[id number])

BI-RADS CATEGORY  1: Negative.

## 2021-07-10 ENCOUNTER — Ambulatory Visit (INDEPENDENT_AMBULATORY_CARE_PROVIDER_SITE_OTHER): Payer: Commercial Managed Care - PPO | Admitting: Obstetrics and Gynecology

## 2021-07-10 ENCOUNTER — Encounter: Payer: Self-pay | Admitting: Obstetrics and Gynecology

## 2021-07-10 ENCOUNTER — Other Ambulatory Visit: Payer: Self-pay

## 2021-07-10 VITALS — Ht 67.0 in | Wt 181.0 lb

## 2021-07-10 DIAGNOSIS — Z6828 Body mass index (BMI) 28.0-28.9, adult: Secondary | ICD-10-CM

## 2021-07-10 DIAGNOSIS — E663 Overweight: Secondary | ICD-10-CM

## 2021-07-10 NOTE — Progress Notes (Signed)
I connected with Jillian Foster on 07/10/21 at 11:10 AM EDT by telephone and verified that I am speaking with the correct person using two identifiers.   I discussed the limitations, risks, security and privacy concerns of performing an evaluation and management service by telephone and the availability of in person appointments. I also discussed with the patient that there may be a patient responsible charge related to this service. The patient expressed understanding and agreed to proceed.  The patient was at home I spoke with the patient from my workstation phone The names of people involved in this encounter were: Jillian Foster , and [name]   Gynecology Office Visit  Chief Complaint:  Chief Complaint  Patient presents with   Follow-up    Phone visit - weight loss, no concerns.    History of Present Illness: Patientis a 50 y.o. G58P1011 female, who presents for the evaluation of the desire to lose weight. She has lost 0 pounds 1 months. The patient states the following symptoms since starting her weight loss therapy: appetite suppression, energy, and weight loss.  The patient also reports no other ill effects. The patient specifically denies heart palpitations, anxiety, and insomnia.    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    Anxiety    Diabetes mellitus without complication (HCC)    Genital herpes    Goiter    History of abnormal cervical Pap smear 2015   POsitive HRHPV   Insomnia     Past Surgical History:  Past Surgical History:  Procedure Laterality Date   BIOPSY THYROID  01/18/2014   B9 FNA   BREAST BIOPSY  08/2010   Left benign   Fibroadenoma   CESAREAN SECTION  05/2000   Failed ECV..Breech   CHOLECYSTECTOMY     COLPOSCOPY     COMBINED HYSTEROSCOPY DIAGNOSTIC / D&C     INTRAUTERINE  DEVICE (IUD) INSERTION N/A 10/10/2018   Procedure: INTRAUTERINE DEVICE (IUD) INSERTION;  Surgeon: Rubie Maid, MD;  Location: [facility];  Service: Gynecology;  Laterality: N/A;   INTRAUTERINE DEVICE INSERTION  08/25/2013   IUD REMOVAL N/A 10/10/2018   Procedure: INTRAUTERINE DEVICE (IUD) REMOVAL;  Surgeon: Rubie Maid, MD;  Location: [facility];  Service: Gynecology;  Laterality: N/A;  CERVICAL DILATION    Gynecologic History: No LMP recorded. (Menstrual status: IUD).  Obstetric History: G2P1011  Family History:  Family History  Problem Relation Age of Onset   Diabetes Maternal Grandmother        Type 2   Healthy Father    Healthy Mother    Cancer Neg Hx    Hypertension Neg Hx    Stroke Neg Hx    Thyroid disease Neg Hx    Breast cancer Neg Hx     Social History:  Social History   Socioeconomic History   Marital status: Single    Spouse name: Not on file   Number of children: 1   Years of education: Not on file   Highest education level: Not on file  Occupational History   Not on file  Tobacco Use   Smoking status: Never   Smokeless tobacco: Never  Vaping Use   Vaping Use: Never used  Substance and Sexual Activity   Alcohol use: Yes   Drug use: No   Sexual activity: Yes  Partners: Male    Birth control/protection: I.U.D.  Other Topics Concern   Not on file  Social History Narrative   Not on file   Social Determinants of Health   Financial Resource Strain: Not on file  Food Insecurity: Not on file  Transportation Needs: Not on file  Physical Activity: Not on file  Stress: Not on file  Social Connections: Not on file  Intimate Partner Violence: Not on file    Allergies:  No Known Allergies  Medications: Prior to Admission medications   Medication Sig Start Date End Date Taking? Authorizing Provider  Blood Glucose Monitoring Suppl (GLUCOCOM BLOOD GLUCOSE MONITOR) DEVI One Touch Verio meter. Use to check sugar once daily. 12/19/16   [provider]  glucose blood test strip Use once daily. 02/02/15   [provider]  hydroxychloroquine (PLAQUENIL) 200 MG tablet Take 200 mg by mouth in the morning and at bedtime. 12/02/20 12/02/21  [provider]  ibuprofen (ADVIL,MOTRIN) 800 MG tablet Take 1 tablet (800 mg total) by mouth every 8 (eight) hours as needed. 10/10/18   Rubie Maid, MD  insulin glargine (LANTUS) 100 UNIT/ML injection Inject into the skin daily. 18 units nightly    [provider]  levonorgestrel (MIRENA) 20 MCG/24HR IUD 1 each by Intrauterine route once.    [provider]  meloxicam (MOBIC) 7.5 MG tablet Take 7.5 mg by mouth daily.    [provider]  metFORMIN (GLUCOPHAGE) 500 MG tablet Take 500 mg by mouth 2 (two) times daily with a meal.    [provider]  OZEMPIC, 1 MG/DOSE, 4 MG/3ML SOPN  06/10/20   [provider]  phentermine (ADIPEX-P) 37.5 MG tablet Take 1 tablet (37.5 mg total) by mouth daily before breakfast. 06/12/21   [name], MD  pravastatin (PRAVACHOL) 20 MG tablet Take 20 mg by mouth daily.    [provider]    Physical Exam Height 5' 7"  (1.702 m), weight 181 lb (82.1 kg). Wt Readings from Last 3 Encounters:  07/10/21 181 lb (82.1 kg)  06/12/21 181 lb (82.1 kg)  05/12/21 173 lb 3.2 oz (78.6 kg)  Body mass index is 28.35 kg/m.  No physical exam as this was a remote telephone visit to promote social distancing during the current COVID-19 Pandemic  Assessment: 50 y.o. G2P1011 medication follow up  Plan: Problem List Items Addressed This Visit   None   1) 1500 Calorie ADA Diet  2) Patient education given regarding appropriate lifestyle changes for weight loss including: regular physical activity, healthy coping strategies, caloric restriction and healthy eating patterns.  3) Patient will be started on weight loss medication. The risks and benefits and side effects of medication, such as Adipex  (Phenteramine) ,  Tenuate (Diethylproprion), Contrave (buproprion/naltrexone), Qsymia (phentermine/topiramate), and Saxenda (liraglutide) is discussed. The pros and cons of suppressing appetite and boosting metabolism is discussed. Risks of tolerence and addiction is discussed for selected agents discussed. Use of medicine will ne short term, such as 3-4 months at a time followed by a period of time off of the medicine to avoid these risks and side effects for Adipex, Qsymia, and Tenuate discussed. Pt to call with any negative side effects and agrees to keep follow up appts. - has also been of her ozempic secondary to pharmacy being unable to obtain the 25m dose currently.  Discussed Mounjaro as alternative and has FDA indication for weight loss as well.  Maybe something to consider with  endocrinology - 3  month phentermine holiday to start now   4) Telephone time 6:47mn  5) Return in about 3 months (around 10/09/2021) for medication follow up.    AMalachy Mood MD, [facility], [facility] 07/10/2021, 11:26 AM

## 2021-08-23 ENCOUNTER — Encounter: Payer: Self-pay | Admitting: *Deleted

## 2021-08-23 ENCOUNTER — Emergency Department
Admission: EM | Admit: 2021-08-23 | Discharge: 2021-08-24 | Disposition: A | Discharge status: Home / Self Care | Payer: Commercial Managed Care - PPO | Attending: Emergency Medicine | Admitting: Emergency Medicine

## 2021-08-23 ENCOUNTER — Other Ambulatory Visit: Payer: Self-pay

## 2021-08-23 ENCOUNTER — Emergency Department: Payer: Commercial Managed Care - PPO

## 2021-08-23 DIAGNOSIS — E119 Type 2 diabetes mellitus without complications: Secondary | ICD-10-CM | POA: Insufficient documentation

## 2021-08-23 DIAGNOSIS — Z794 Long term (current) use of insulin: Secondary | ICD-10-CM | POA: Diagnosis not present

## 2021-08-23 DIAGNOSIS — R079 Chest pain, unspecified: Secondary | ICD-10-CM | POA: Diagnosis not present

## 2021-08-23 DIAGNOSIS — Z7984 Long term (current) use of oral hypoglycemic drugs: Secondary | ICD-10-CM | POA: Insufficient documentation

## 2021-08-23 LAB — CBC
HCT: 40.9 % (ref 36.0–46.0)
Hemoglobin: 13 g/dL (ref 12.0–15.0)
MCH: 27.4 pg (ref 26.0–34.0)
MCHC: 31.8 g/dL (ref 30.0–36.0)
MCV: 86.1 fL (ref 80.0–100.0)
Platelets: 252 10*3/uL (ref 150–400)
RBC: 4.75 MIL/uL (ref 3.87–5.11)
RDW: 12.9 % (ref 11.5–15.5)
WBC: 5.3 10*3/uL (ref 4.0–10.5)
nRBC: 0 % (ref 0.0–0.2)

## 2021-08-23 LAB — BASIC METABOLIC PANEL
Anion gap: 6 (ref 5–15)
BUN: 19 mg/dL (ref 6–20)
CO2: 30 mmol/L (ref 22–32)
Calcium: 9.7 mg/dL (ref 8.9–10.3)
Chloride: 100 mmol/L (ref 98–111)
Creatinine, Ser: 0.83 mg/dL (ref 0.44–1.00)
GFR, Estimated: >60 mL/min (ref >60)
Glucose, Bld: 128 mg/dL — ABNORMAL HIGH (ref 70–99)
Potassium: 4.8 mmol/L (ref 3.5–5.1)
Sodium: 136 mmol/L (ref 135–145)

## 2021-08-23 LAB — TROPONIN I (HIGH SENSITIVITY): Troponin I (High Sensitivity): 2 ng/L (ref <18)

## 2021-08-23 MED ORDER — KETOROLAC TROMETHAMINE 30 MG/ML IJ SOLN
15.0000 mg | Freq: Once | INTRAMUSCULAR | Status: AC
  Administered 2021-08-24: 15 mg via INTRAVENOUS
  Filled 2021-08-23: qty 1

## 2021-08-23 NOTE — ED Provider Notes (Signed)
[facility] Emergency Department Provider Note   ____________________________________________   Event Date/Time   First MD Initiated Contact with Patient 08/23/21 2303     (approximate)  I have reviewed the triage vital signs and the nursing notes.   HISTORY  Chief Complaint Chest Pain    HPI Jillian Foster is a 50 y.o. female who presents to the ED from home with a chief complaint of chest pain.  Patient reports left-sided chest pain intermittently x2 days.  Describes pain as a soreness, not exacerbated by deep breathing, feels like a pulling sensation when she moves.  Denies associated diaphoresis, shortness of breath, palpitations, nausea/vomiting or dizziness.  Denies recent fever, cough, abdominal pain.  Patient has IUD.  Denies recent trauma or travel.      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    Anxiety    Diabetes mellitus without complication ([place])    Genital herpes    Goiter    History of abnormal cervical Pap smear 2015   POsitive HRHPV   Insomnia     Patient Active Problem List   Diagnosis Date Noted   Vitamin D deficiency 10/21/2018   History of abnormal cervical Pap smear    Goiter    Genital herpes    Diabetes mellitus without complication ([place])    Anxiety     Past Surgical History:  Procedure Laterality Date   BIOPSY THYROID  01/18/2014   B9 FNA   BREAST BIOPSY  08/2010   Left benign   Fibroadenoma   CESAREAN SECTION  05/2000   Failed ECV..Breech   CHOLECYSTECTOMY     COLPOSCOPY     COMBINED HYSTEROSCOPY DIAGNOSTIC / D&C     INTRAUTERINE DEVICE (IUD) INSERTION N/A 10/10/2018   Procedure: INTRAUTERINE DEVICE (IUD) INSERTION;  Surgeon: Rubie Maid, MD;  Location: [facility] ORS;  Service: Gynecology;  Laterality: N/A;   INTRAUTERINE DEVICE INSERTION  08/25/2013   IUD REMOVAL N/A 10/10/2018   Procedure: INTRAUTERINE DEVICE (IUD) REMOVAL;  Surgeon: Rubie Maid, MD;  Location:  [facility] ORS;  Service: Gynecology;  Laterality: N/A;  CERVICAL DILATION    Prior to Admission medications   Medication Sig Start Date End Date Taking? Authorizing Provider  Blood Glucose Monitoring Suppl (GLUCOCOM BLOOD GLUCOSE MONITOR) DEVI One Touch Verio meter. Use to check sugar once daily. 12/19/16   [provider]  glucose blood test strip Use once daily. 02/02/15   [provider]  hydroxychloroquine (PLAQUENIL) 200 MG tablet Take 200 mg by mouth in the morning and at bedtime. 12/02/20 12/02/21  [provider]  ibuprofen (ADVIL,MOTRIN) 800 MG tablet Take 1 tablet (800 mg total) by mouth every 8 (eight) hours as needed. 10/10/18   Rubie Maid, MD  insulin glargine (LANTUS) 100 UNIT/ML injection Inject into the skin daily. 18 units nightly    [provider]  levonorgestrel (MIRENA) 20 MCG/24HR IUD 1 each by Intrauterine route once.    [provider]  meloxicam (MOBIC) 7.5 MG tablet Take 7.5 mg by mouth daily.    [provider]  metFORMIN (GLUCOPHAGE) 500 MG tablet Take 500 mg by mouth 2 (two) times daily with a meal.    [provider]  OZEMPIC, 1 MG/DOSE, 4 MG/3ML SOPN  06/10/20   [provider]  pravastatin (PRAVACHOL) 20 MG tablet Take 20 mg by mouth daily.    [provider]    Allergies Patient has no known allergies.  Family  History  Problem Relation Age of Onset   Diabetes Maternal Grandmother        Type 2   Healthy Father    Healthy Mother    Cancer Neg Hx    Hypertension Neg Hx    Stroke Neg Hx    Thyroid disease Neg Hx    Breast cancer Neg Hx     Social History Social History   Tobacco Use   Smoking status: Never   Smokeless tobacco: Never  Vaping Use   Vaping Use: Never used  Substance Use Topics   Alcohol use: Yes   Drug use: No    Review of Systems  Constitutional: No fever/chills Eyes: No visual changes. ENT: No sore throat. Cardiovascular: Positive for chest  pain. Respiratory: Denies shortness of breath. Gastrointestinal: No abdominal pain.  No nausea, no vomiting.  No diarrhea.  No constipation. Genitourinary: Negative for dysuria. Musculoskeletal: Negative for back pain. Skin: Negative for rash. Neurological: Negative for headaches, focal weakness or numbness.   ____________________________________________   PHYSICAL EXAM:  VITAL SIGNS: ED Triage Vitals  Enc Vitals Group     BP 08/23/21 2159 (!) 137/101     Pulse Rate 08/23/21 2159 84     Resp 08/23/21 2159 18     Temp 08/23/21 2159 98 F (36.7 C)     Temp Source 08/23/21 2159 Oral     SpO2 08/23/21 2159 95 %     Weight 08/23/21 2151 180 lb (81.6 kg)     Height 08/23/21 2151 5' 7"  (1.702 m)     Head Circumference --      Peak Flow --      Pain Score 08/23/21 2151 8     Pain Loc --      Pain Edu? --      Excl. in [place]? --     Constitutional: Alert and oriented. Well appearing and in no acute distress. Eyes: Conjunctivae are normal. PERRL. EOMI. Head: Atraumatic. Nose: No congestion/rhinnorhea. Mouth/Throat: Mucous membranes are moist.   Neck: No stridor.   Cardiovascular: Normal rate, regular rhythm. Grossly normal heart sounds.  Good peripheral circulation. Respiratory: Normal respiratory effort.  No retractions. Lungs CTAB.  Left anterior chest wall tender to palpation. Gastrointestinal: Soft and nontender. No distention. No abdominal bruits. No CVA tenderness. Musculoskeletal: No lower extremity tenderness nor edema.  No joint effusions. Neurologic:  Normal speech and language. No gross focal neurologic deficits are appreciated. No gait instability. Skin:  Skin is warm, dry and intact. No rash noted. Psychiatric: Mood and affect are normal. Speech and behavior are normal.  ____________________________________________   LABS (all labs ordered are listed, but only abnormal results are displayed)  Labs Reviewed  BASIC METABOLIC PANEL - Abnormal; Notable for the  following components:      Result Value   Glucose, Bld 128 (*)    All other components within normal limits  CBC  D-DIMER, QUANTITATIVE  POC URINE PREG, ED  TROPONIN I (HIGH SENSITIVITY)  TROPONIN I (HIGH SENSITIVITY)   ____________________________________________  EKG  ED ECG REPORT I, Kamesha Herne J, the attending physician, personally viewed and interpreted this ECG.   Date: 08/23/2021  EKG Time: 2157  Rate: 95  Rhythm: normal sinus rhythm  Axis: Normal  Intervals:none  ST&T Change: Nonspecific  ____________________________________________  RADIOLOGY I, Chaitanya Amedee J, personally viewed and evaluated these images (plain radiographs) as part of my medical decision making, as well as reviewing the written report by the radiologist.  ED MD interpretation: No  acute cardiopulmonary process  Official radiology report(s): DG Chest 2 View  Result Date: 08/23/2021 CLINICAL DATA:  Chest pain. EXAM: CHEST - 2 VIEW COMPARISON:  None. FINDINGS: The heart size and mediastinal contours are within normal limits. Both lungs are clear. The visualized skeletal structures are unremarkable. IMPRESSION: No active cardiopulmonary disease. Electronically Signed   By: Anner Crete M.D.   On: 08/23/2021 22:22    ____________________________________________   PROCEDURES  Procedure(s) performed (including Critical Care):  .1-3 Lead EKG Interpretation Performed by: Paulette Blanch, MD Authorized by: Paulette Blanch, MD     Interpretation: normal     ECG rate:  95   ECG rate assessment: normal     Rhythm: sinus rhythm     Ectopy: none     Conduction: normal   Comments:     Patient placed on cardiac monitor to evaluate for arrhythmias   ____________________________________________   INITIAL IMPRESSION / ASSESSMENT AND PLAN / ED COURSE  As part of my medical decision making, I reviewed the following data within the [facility] notes reviewed and incorporated, Labs  reviewed, EKG interpreted, Old chart reviewed, Radiograph reviewed, and Notes from prior ED visits     50 year old female presenting with chest pain. Differential diagnosis includes, but is not limited to, ACS, aortic dissection, pulmonary embolism, cardiac tamponade, pneumothorax, pneumonia, pericarditis, myocarditis, GI-related causes including esophagitis/gastritis, and musculoskeletal chest wall pain.     Initial troponin, EKG and chest x-ray unremarkable.  Will repeat time troponin, check D-dimer.  Administer IV ketorolac for pain and will reassess.  Clinical Course as of 08/24/21 0045  Thu Aug 24, 2021  0038 Updated patient on negative repeat troponin and D-dimer.  She is feeling better after IV Toradol.  Strict return precautions given.  Patient verbalizes understanding and agrees with plan of care. [JS]    Clinical Course User Index [JS] Paulette Blanch, MD     ____________________________________________   FINAL CLINICAL IMPRESSION(S) / ED DIAGNOSES  Final diagnoses:  Nonspecific chest pain     ED Discharge Orders     None        Note:  This document was prepared using Dragon voice recognition software and may include unintentional dictation errors.    Paulette Blanch, MD 08/24/21 [id number]

## 2021-08-23 NOTE — ED Triage Notes (Signed)
Pt has left side chest pain for 2 days.  No sob.  No cough.  No n/v   intermittent pain in chest.  pt alert speech clear

## 2021-08-24 LAB — TROPONIN I (HIGH SENSITIVITY): Troponin I (High Sensitivity): 3 ng/L (ref <18)

## 2021-08-24 LAB — D-DIMER, QUANTITATIVE: D-Dimer, Quant: 0.28 ug/mL-FEU (ref 0.00–0.50)

## 2021-08-24 NOTE — Discharge Instructions (Signed)
You may take Ibuprofen as needed for discomfort.  Apply moist heat to affected area several times daily.  Return to the ER for worsening symptoms, persistent vomiting, difficulty breathing or other concerns.

## 2021-09-27 ENCOUNTER — Encounter: Payer: Self-pay | Admitting: Obstetrics and Gynecology

## 2021-10-02 ENCOUNTER — Encounter: Payer: Self-pay | Admitting: Obstetrics and Gynecology

## 2021-10-02 ENCOUNTER — Other Ambulatory Visit: Payer: Self-pay

## 2021-10-02 ENCOUNTER — Ambulatory Visit (INDEPENDENT_AMBULATORY_CARE_PROVIDER_SITE_OTHER): Payer: Commercial Managed Care - PPO | Admitting: Obstetrics and Gynecology

## 2021-10-02 VITALS — Ht 67.0 in | Wt 178.0 lb

## 2021-10-02 DIAGNOSIS — E119 Type 2 diabetes mellitus without complications: Secondary | ICD-10-CM | POA: Diagnosis not present

## 2021-10-02 DIAGNOSIS — E663 Overweight: Secondary | ICD-10-CM | POA: Diagnosis not present

## 2021-10-02 DIAGNOSIS — Z6827 Body mass index (BMI) 27.0-27.9, adult: Secondary | ICD-10-CM

## 2021-10-02 MED ORDER — PHENTERMINE HCL 37.5 MG PO TABS: 37.5000 mg | ORAL_TABLET | Freq: Every day | ORAL | 0 refills | Status: DC

## 2021-10-02 MED ORDER — PHENTERMINE HCL 37.5 MG PO TABS: 37.5000 mg | ORAL_TABLET | Freq: Every day | ORAL | 0 refills | Status: AC

## 2021-10-02 NOTE — Progress Notes (Signed)
I connected with Jillian Foster on 10/02/21 at 10:50 AM EST by telephone and verified that I am speaking with the correct person using two identifiers.   I discussed the limitations, risks, security and privacy concerns of performing an evaluation and management service by telephone and the availability of in person appointments. I also discussed with the patient that there may be a patient responsible charge related to this service. The patient expressed understanding and agreed to proceed.  The patient was at home I spoke with the patient from my workstation phone The names of people involved in this encounter were: Jillian Foster , and Jillian Foster   Gynecology Office Visit  Chief Complaint:  Chief Complaint  Patient presents with   Follow-up    Phone visit - weight loss    History of Present Illness: Patientis a 50 y.o. G69P1011 female, who presents for the evaluation of the desire to lose weight. She has lost 2 pounds 1 months. The patient states the following symptoms since starting her weight loss therapy: appetite suppression, energy, and weight loss.  The patient also reports no other ill effects. The patient specifically denies heart palpitations, anxiety, and insomnia.    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    Anxiety    Diabetes mellitus without complication (HCC)    Genital herpes    Goiter    History of abnormal cervical Pap smear 2015   POsitive HRHPV   Insomnia     Past Surgical History:  Past Surgical History:  Procedure Laterality Date   BIOPSY THYROID  01/18/2014   B9 FNA   BREAST BIOPSY  08/2010   Left benign   Fibroadenoma   CESAREAN SECTION  05/2000   Failed ECV..Breech   CHOLECYSTECTOMY     COLPOSCOPY     COMBINED HYSTEROSCOPY DIAGNOSTIC / D&C     INTRAUTERINE DEVICE (IUD)  INSERTION N/A 10/10/2018   Procedure: INTRAUTERINE DEVICE (IUD) INSERTION;  Surgeon: Rubie Maid, MD;  Location: [facility];  Service: Gynecology;  Laterality: N/A;   INTRAUTERINE DEVICE INSERTION  08/25/2013   IUD REMOVAL N/A 10/10/2018   Procedure: INTRAUTERINE DEVICE (IUD) REMOVAL;  Surgeon: Rubie Maid, MD;  Location: [facility];  Service: Gynecology;  Laterality: N/A;  CERVICAL DILATION    Gynecologic History: No LMP recorded. (Menstrual status: IUD).  Obstetric History: G2P1011  Family History:  Family History  Problem Relation Age of Onset   Diabetes Maternal Grandmother        Type 2   Healthy Father    Healthy Mother    Cancer Neg Hx    Hypertension Neg Hx    Stroke Neg Hx    Thyroid disease Neg Hx    Breast cancer Neg Hx     Social History:  Social History   Socioeconomic History   Marital status: Single    Spouse name: Not on file   Number of children: 1   Years of education: Not on file   Highest education level: Not on file  Occupational History   Not on file  Tobacco Use   Smoking status: Never   Smokeless tobacco: Never  Vaping Use   Vaping Use: Never used  Substance and Sexual Activity   Alcohol use: Yes   Drug use: No   Sexual activity: Yes    Partners: Male  Birth control/protection: I.U.D.  Other Topics Concern   Not on file  Social History Narrative   Not on file   Social Determinants of Health   Financial Resource Strain: Not on file  Food Insecurity: Not on file  Transportation Needs: Not on file  Physical Activity: Not on file  Stress: Not on file  Social Connections: Not on file  Intimate Partner Violence: Not on file    Allergies:  No Known Allergies  Medications: Prior to Admission medications   Medication Sig Start Date End Date Taking? Authorizing Provider  Blood Glucose Monitoring Suppl (GLUCOCOM BLOOD GLUCOSE MONITOR) DEVI One Touch Verio meter. Use to check sugar once daily. 12/19/16   [provider]   glucose blood test strip Use once daily. 02/02/15   [provider]  hydroxychloroquine (PLAQUENIL) 200 MG tablet Take 200 mg by mouth in the morning and at bedtime. 12/02/20 12/02/21  [provider]  ibuprofen (ADVIL,MOTRIN) 800 MG tablet Take 1 tablet (800 mg total) by mouth every 8 (eight) hours as needed. 10/10/18   Rubie Maid, MD  insulin glargine (LANTUS) 100 UNIT/ML injection Inject into the skin daily. 18 units nightly    [provider]  levonorgestrel (MIRENA) 20 MCG/24HR IUD 1 each by Intrauterine route once.    [provider]  meloxicam (MOBIC) 7.5 MG tablet Take 7.5 mg by mouth daily.    [provider]  metFORMIN (GLUCOPHAGE) 500 MG tablet Take 500 mg by mouth 2 (two) times daily with a meal.    [provider]  OZEMPIC, 1 MG/DOSE, 4 MG/3ML SOPN  06/10/20   [provider]  pravastatin (PRAVACHOL) 20 MG tablet Take 20 mg by mouth daily.    [provider]    Physical Exam Height 5' 7"  (1.702 m), weight 178 lb (80.7 kg). Wt Readings from Last 3 Encounters:  10/02/21 178 lb (80.7 kg)  08/23/21 180 lb (81.6 kg)  07/10/21 181 lb (82.1 kg)  Body mass index is 27.88 kg/m.  No physical exam as this was a remote telephone visit to promote social distancing during the current COVID-19 Pandemic  Assessment: 50 y.o. G2P1011 medication follow up   Plan: Problem List Items Addressed This Visit   None Visit Diagnoses     Overweight (BMI 25.0-29.9)    -  Primary   BMI 27.0-27.9,adult       Type 2 diabetes mellitus without complication, without long-term current use of insulin (HCC)           1) 1500 Calorie ADA Diet  2) Patient education given regarding appropriate lifestyle changes for weight loss including: regular physical activity, healthy coping strategies, caloric restriction and healthy eating patterns.  3) Patient will be started on weight loss medication. The risks and benefits and side effects  of medication, such as Adipex (Phenteramine) ,  Tenuate (Diethylproprion), Belviq (lorcarsin), Contrave (buproprion/naltrexone), Qsymia (phentermine/topiramate), and Saxenda (liraglutide) is discussed. The pros and cons of suppressing appetite and boosting metabolism is discussed. Risks of tolerence and addiction is discussed for selected agents discussed. Use of medicine will ne short term, such as 3-4 months at a time followed by a period of time off of the medicine to avoid these risks and side effects for Adipex, Qsymia, and Tenuate discussed. Pt to call with any negative side effects and agrees to keep follow up appts.  4) Patient to take medication, with the benefits of appetite suppression and metabolism boost d/w pt, along with the side effects and  risk factors of long term use that will be avoided with our use of short bursts of therapy. Rx provided.    5) Telephone Time: 05:45,om  6) No follow-ups on file.    Jillian Mood, MD, [facility], [facility] 10/02/2021, 10:36 AM

## 2021-10-02 NOTE — Addendum Note (Signed)
Addended by: Dorthula Nettles on: 10/02/2021 02:23 PM   Modules accepted: Orders

## 2021-10-13 ENCOUNTER — Other Ambulatory Visit: Payer: Self-pay

## 2021-10-13 ENCOUNTER — Encounter: Payer: Self-pay | Admitting: Advanced Practice Midwife

## 2021-10-13 ENCOUNTER — Ambulatory Visit (INDEPENDENT_AMBULATORY_CARE_PROVIDER_SITE_OTHER): Payer: Commercial Managed Care - PPO | Admitting: Advanced Practice Midwife

## 2021-10-13 ENCOUNTER — Other Ambulatory Visit (HOSPITAL_COMMUNITY)
Admission: RE | Admit: 2021-10-13 | Discharge: 2021-10-13 | Disposition: A | Discharge status: Home / Self Care | Payer: Commercial Managed Care - PPO | Source: Ambulatory Visit | Attending: Advanced Practice Midwife | Admitting: Advanced Practice Midwife

## 2021-10-13 VITALS — BP 126/82 | Ht 67.0 in | Wt 187.0 lb

## 2021-10-13 DIAGNOSIS — N898 Other specified noninflammatory disorders of vagina: Secondary | ICD-10-CM | POA: Diagnosis present

## 2021-10-13 MED ORDER — FLUCONAZOLE 150 MG PO TABS: 150.0000 mg | ORAL_TABLET | Freq: Once | ORAL | 1 refills | Status: AC

## 2021-10-13 NOTE — Progress Notes (Signed)
Patient ID: Jillian Foster, female   DOB: [birth year], 50 y.o.   MRN: [id number]  Reason for Consult: Vaginitis (Started a probiotic and not feeling right in vaginal area. RM 5)   Subjective:  HPI:  Jillian Foster is a 50 y.o. female being seen for vaginal symptoms. She started a probiotic recently and for the past 2 days she is having what she describes as vaginal "tingling". She denies pain, itching, or odor. She admits scant thin white discharge. She denies history of yeast infection, however, her medical history is notable for diabetes with decent control of blood sugar. She was prescribed anti-fungal by previous provider as a preventative- no lab work done to confirm yeast. She prefers to have Rx to treat possible yeast over the weekend if symptoms worsen. Comfort measures reviewed. She denies concern for STDs.  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    Anxiety    Diabetes mellitus without complication ([place])    Genital herpes    Goiter    History of abnormal cervical Pap smear 2015   POsitive HRHPV   Insomnia    Family History  Problem Relation Age of Onset   Diabetes Maternal Grandmother        Type 2   Healthy Father    Healthy Mother    Cancer Neg Hx    Hypertension Neg Hx    Stroke Neg Hx    Thyroid disease Neg Hx    Breast cancer Neg Hx    Past Surgical History:  Procedure Laterality Date   BIOPSY THYROID  01/18/2014   B9 FNA   BREAST BIOPSY  08/2010   Left benign   Fibroadenoma   CESAREAN SECTION  05/2000   Failed ECV..Breech   CHOLECYSTECTOMY     COLPOSCOPY     COMBINED HYSTEROSCOPY DIAGNOSTIC / D&C     INTRAUTERINE DEVICE (IUD) INSERTION N/A 10/10/2018   Procedure: INTRAUTERINE DEVICE (IUD) INSERTION;  Surgeon: Rubie Maid, MD;  Location: [facility];  Service: Gynecology;  Laterality: N/A;   INTRAUTERINE DEVICE INSERTION  08/25/2013   IUD REMOVAL N/A 10/10/2018   Procedure: INTRAUTERINE DEVICE (IUD)  REMOVAL;  Surgeon: Rubie Maid, MD;  Location: [facility];  Service: Gynecology;  Laterality: N/A;  CERVICAL DILATION    Short Social History:  Social History   Tobacco Use   Smoking status: Never   Smokeless tobacco: Never  Substance Use Topics   Alcohol use: Yes    No Known Allergies  Current Outpatient Medications  Medication Sig Dispense Refill   fluconazole (DIFLUCAN) 150 MG tablet Take 1 tablet (150 mg total) by mouth once for 1 dose. Can take additional dose three days later if symptoms persist 1 tablet 1   Blood Glucose Monitoring Suppl (GLUCOCOM BLOOD GLUCOSE MONITOR) DEVI One Touch Verio meter. Use to check sugar once daily.     glucose blood test strip Use once daily.     hydroxychloroquine (PLAQUENIL) 200 MG tablet Take 200 mg by mouth in the morning and at bedtime.     ibuprofen (ADVIL,MOTRIN) 800 MG tablet Take 1 tablet (800 mg total) by mouth every 8 (eight) hours as needed. 60 tablet 1   insulin glargine (LANTUS) 100 UNIT/ML injection Inject into the skin daily. 18 units nightly     levonorgestrel (MIRENA) 20 MCG/24HR IUD 1 each by Intrauterine route once.     meloxicam (MOBIC) 7.5 MG tablet Take 7.5 mg by mouth daily.  metFORMIN (GLUCOPHAGE) 500 MG tablet Take 500 mg by mouth 2 (two) times daily with a meal.     OZEMPIC, 1 MG/DOSE, 4 MG/3ML SOPN      phentermine (ADIPEX-P) 37.5 MG tablet Take 1 tablet (37.5 mg total) by mouth daily before breakfast. 30 tablet 0   pravastatin (PRAVACHOL) 20 MG tablet Take 20 mg by mouth daily.     No current facility-administered medications for this visit.    Review of Systems  Constitutional:  Negative for chills and fever.  HENT:  Negative for congestion, ear discharge, ear pain, hearing loss, sinus pain and sore throat.   Eyes:  Negative for blurred vision and double vision.  Respiratory:  Negative for cough, shortness of breath and wheezing.   Cardiovascular:  Negative for chest pain, palpitations and leg swelling.   Gastrointestinal:  Negative for abdominal pain, blood in stool, constipation, diarrhea, heartburn, melena, nausea and vomiting.  Genitourinary:  Negative for dysuria, flank pain, frequency, hematuria and urgency.       Positive for vaginal "tingling"  Musculoskeletal:  Negative for back pain, joint pain and myalgias.  Skin:  Negative for itching and rash.  Neurological:  Negative for dizziness, tingling, tremors, sensory change, speech change, focal weakness, seizures, loss of consciousness, weakness and headaches.  Endo/Heme/Allergies:  Negative for environmental allergies. Does not bruise/bleed easily.  Psychiatric/Behavioral:  Negative for depression, hallucinations, memory loss, substance abuse and suicidal ideas. The patient is not nervous/anxious and does not have insomnia.        Objective:  Objective   Vitals:   10/13/21 1020  BP: 126/82  Weight: 187 lb (84.8 kg)  Height: 5' 7"  (1.702 m)   Body mass index is 29.29 kg/m. Constitutional: Well nourished, well developed female in no acute distress.  HEENT: normal Skin: Warm and dry.  Cardiovascular: Regular rate and rhythm.   Extremity:  no edema   Respiratory: Clear to auscultation bilateral. Normal respiratory effort Neuro: DTRs 2+, Cranial nerves grossly intact Psych: Alert and Oriented x3. No memory deficits. Normal mood and affect.    Pelvic exam:  is not limited by body habitus EGBUS: within normal limits Vagina: within normal limits and with normal mucosa, scant thin white discharge Cervix: not evaluated   Assessment/Plan:     50 y.o. G2 P21 female with possible yeast vaginitis  Aptima: vaginitis Rx Diflucan Follow up as needed after lab results Apple Cider Vinegar tub soaks Boric Acid suppositories for prevention   [facility] 10/13/2021, 11:03 AM

## 2021-10-13 NOTE — Addendum Note (Signed)
Addended by: Rod Can on: 10/13/2021 11:14 AM   Modules accepted: Orders

## 2021-10-17 LAB — CERVICOVAGINAL ANCILLARY ONLY
Bacterial Vaginitis (gardnerella): NEGATIVE
Candida Glabrata: NEGATIVE
Candida Vaginitis: POSITIVE — AB
Comment: NEGATIVE
Comment: NEGATIVE
Comment: NEGATIVE

## 2021-11-15 ENCOUNTER — Encounter: Payer: Self-pay | Admitting: Obstetrics and Gynecology

## 2021-11-15 DIAGNOSIS — Z77120 Contact with and (suspected) exposure to mold (toxic): Secondary | ICD-10-CM

## 2021-11-16 ENCOUNTER — Other Ambulatory Visit: Payer: Commercial Managed Care - PPO

## 2021-11-16 ENCOUNTER — Other Ambulatory Visit: Payer: Self-pay

## 2021-11-16 DIAGNOSIS — Z77120 Contact with and (suspected) exposure to mold (toxic): Secondary | ICD-10-CM

## 2021-11-22 LAB — ALLERGEN PROFILE, MOLD
Alternaria Alternata IgE: <0.1 kU/L
Aspergillus Fumigatus IgE: <0.1 kU/L
Aureobasidi Pullulans IgE: <0.1 kU/L
Candida Albicans IgE: <0.1 kU/L
Cladosporium Herbarum IgE: <0.1 kU/L
M009-IgE Fusarium proliferatum: <0.1 kU/L
M014-IgE Epicoccum purpur: <0.1 kU/L
Mucor Racemosus IgE: <0.1 kU/L
Penicillium Chrysogen IgE: <0.1 kU/L
Phoma Betae IgE: <0.1 kU/L
Setomelanomma Rostrat: <0.1 kU/L
Stemphylium Herbarum IgE: <0.1 kU/L

## 2022-05-04 ENCOUNTER — Other Ambulatory Visit: Payer: Self-pay | Admitting: Obstetrics and Gynecology

## 2022-05-04 DIAGNOSIS — Z1231 Encounter for screening mammogram for malignant neoplasm of breast: Secondary | ICD-10-CM

## 2022-05-25 ENCOUNTER — Ambulatory Visit
Admission: RE | Admit: 2022-05-25 | Discharge: 2022-05-25 | Disposition: A | Discharge status: Home / Self Care | Payer: Commercial Managed Care - PPO | Source: Ambulatory Visit | Attending: Obstetrics and Gynecology | Admitting: Obstetrics and Gynecology

## 2022-05-25 DIAGNOSIS — Z1231 Encounter for screening mammogram for malignant neoplasm of breast: Secondary | ICD-10-CM | POA: Diagnosis present

## 2023-01-04 ENCOUNTER — Emergency Department
Admission: EM | Admit: 2023-01-04 | Discharge: 2023-01-04 | Discharge status: Against Medical Advice | Payer: Commercial Managed Care - PPO

## 2023-01-05 ENCOUNTER — Emergency Department
Admission: EM | Admit: 2023-01-05 | Discharge: 2023-01-05 | Disposition: A | Discharge status: Home / Self Care | Payer: Self-pay | Attending: Emergency Medicine | Admitting: Emergency Medicine

## 2023-01-05 ENCOUNTER — Encounter: Payer: Self-pay | Admitting: Emergency Medicine

## 2023-01-05 ENCOUNTER — Other Ambulatory Visit: Payer: Self-pay

## 2023-01-05 DIAGNOSIS — S46912A Strain of unspecified muscle, fascia and tendon at shoulder and upper arm level, left arm, initial encounter: Secondary | ICD-10-CM | POA: Diagnosis not present

## 2023-01-05 DIAGNOSIS — S4992XA Unspecified injury of left shoulder and upper arm, initial encounter: Secondary | ICD-10-CM | POA: Diagnosis present
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
## 2023-01-05 NOTE — Discharge Instructions (Signed)
Follow-up with your primary care provider if any continued problems or concerns.  If you do not have a primary care provider a list of clinics is listed on your discharge papers.  Continue with ibuprofen as needed for inflammation.  You may use ice or heat to your shoulder as needed for discomfort.  Usually it takes 4 to 5 days for soreness to improve.  Turn to the emergency department if any severe worsening of your symptoms.  Please go to the following website to schedule new (and existing) patient appointments:   [email or web address]   The following is a list of primary care offices in the area who are accepting new patients at this time.  Please reach out to one of them directly and let them know you would like to schedule an appointment to follow up on an Emergency Department visit, and/or to establish a new primary care provider (PCP).  There are likely other primary care clinics in the are who are accepting new patients, but this is an excellent place to start:  [place] physician: Dr Lavon Paganini [address] [place], [place] [address] [phone]  [facility] Lead Physician: Dr Steele Sizer [address], [place], [place] [address] [phone]  [place] Physician: Dr Park Liter [address] [place], [place] [address] [phone]  [facility] Lead Physician: Dr Dewaine Oats [address] [place], [place] [address] [phone]  [facility] [place] Physician: Dr Halina Maidens [address] [place], [place], [place] [address] [phone]
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
## 2023-05-16 ENCOUNTER — Ambulatory Visit: Payer: BC Managed Care – PPO | Admitting: Podiatry

## 2024-01-10 ENCOUNTER — Ambulatory Visit: Admitting: Podiatry

## 2024-01-16 LAB — COLOGUARD: COLOGUARD: NEGATIVE
